# Patient Record
Sex: Female | Born: 1950 | Race: White | Hispanic: No | Marital: Married | Smoking: Never smoker
Health system: Southern US, Community
[De-identification: ages and names within clinical notes are randomized; demographics above are authoritative.]

## PROBLEM LIST (undated history)

## (undated) DIAGNOSIS — M8580 Other specified disorders of bone density and structure, unspecified site: Secondary | ICD-10-CM

## (undated) DIAGNOSIS — T4145XA Adverse effect of unspecified anesthetic, initial encounter: Secondary | ICD-10-CM

## (undated) DIAGNOSIS — T8859XA Other complications of anesthesia, initial encounter: Secondary | ICD-10-CM

## (undated) HISTORY — PX: COLONOSCOPY W/ POLYPECTOMY: SHX1380

## (undated) HISTORY — DX: Other specified disorders of bone density and structure, unspecified site: M85.80

## (undated) HISTORY — PX: TUBAL LIGATION: SHX77

## (undated) HISTORY — PX: COLONOSCOPY: SHX174

## (undated) HISTORY — PX: WISDOM TOOTH EXTRACTION: SHX21

---

## 2002-03-23 ENCOUNTER — Other Ambulatory Visit: Admission: RE | Admit: 2002-03-23 | Discharge: 2002-03-23 | Payer: Self-pay | Admitting: Obstetrics and Gynecology

## 2002-04-06 ENCOUNTER — Ambulatory Visit (HOSPITAL_COMMUNITY): Admission: RE | Admit: 2002-04-06 | Discharge: 2002-04-06 | Payer: Self-pay

## 2002-04-06 ENCOUNTER — Encounter: Payer: Self-pay | Admitting: Obstetrics and Gynecology

## 2002-11-02 ENCOUNTER — Ambulatory Visit (HOSPITAL_COMMUNITY): Admission: RE | Admit: 2002-11-02 | Discharge: 2002-11-02 | Payer: Self-pay | Admitting: Gastroenterology

## 2002-11-02 ENCOUNTER — Encounter (INDEPENDENT_AMBULATORY_CARE_PROVIDER_SITE_OTHER): Payer: Self-pay | Admitting: Specialist

## 2003-04-10 ENCOUNTER — Other Ambulatory Visit: Admission: RE | Admit: 2003-04-10 | Discharge: 2003-04-10 | Payer: Self-pay | Admitting: Obstetrics and Gynecology

## 2003-04-18 ENCOUNTER — Ambulatory Visit (HOSPITAL_COMMUNITY): Admission: RE | Admit: 2003-04-18 | Discharge: 2003-04-18 | Payer: Self-pay | Admitting: Obstetrics and Gynecology

## 2003-04-18 ENCOUNTER — Encounter: Payer: Self-pay | Admitting: Obstetrics and Gynecology

## 2004-05-28 ENCOUNTER — Ambulatory Visit (HOSPITAL_COMMUNITY): Admission: RE | Admit: 2004-05-28 | Discharge: 2004-05-28 | Payer: Self-pay | Admitting: Obstetrics and Gynecology

## 2005-03-10 ENCOUNTER — Other Ambulatory Visit: Admission: RE | Admit: 2005-03-10 | Discharge: 2005-03-10 | Payer: Self-pay | Admitting: Obstetrics and Gynecology

## 2005-08-04 ENCOUNTER — Ambulatory Visit (HOSPITAL_COMMUNITY): Admission: RE | Admit: 2005-08-04 | Discharge: 2005-08-04 | Payer: Self-pay | Admitting: Gynecology

## 2006-04-20 ENCOUNTER — Other Ambulatory Visit: Admission: RE | Admit: 2006-04-20 | Discharge: 2006-04-20 | Payer: Self-pay | Admitting: Obstetrics and Gynecology

## 2006-09-06 ENCOUNTER — Ambulatory Visit (HOSPITAL_COMMUNITY): Admission: RE | Admit: 2006-09-06 | Discharge: 2006-09-06 | Payer: Self-pay | Admitting: Obstetrics and Gynecology

## 2007-06-19 ENCOUNTER — Other Ambulatory Visit: Admission: RE | Admit: 2007-06-19 | Discharge: 2007-06-19 | Payer: Self-pay | Admitting: Obstetrics and Gynecology

## 2007-06-20 ENCOUNTER — Ambulatory Visit (HOSPITAL_COMMUNITY): Admission: RE | Admit: 2007-06-20 | Discharge: 2007-06-20 | Payer: Self-pay | Admitting: Obstetrics and Gynecology

## 2008-11-15 ENCOUNTER — Encounter: Payer: Self-pay | Admitting: Gynecology

## 2008-11-15 ENCOUNTER — Other Ambulatory Visit: Admission: RE | Admit: 2008-11-15 | Discharge: 2008-11-15 | Payer: Self-pay | Admitting: Gynecology

## 2008-11-15 ENCOUNTER — Ambulatory Visit: Payer: Self-pay | Admitting: Gynecology

## 2008-12-17 ENCOUNTER — Ambulatory Visit: Payer: Self-pay | Admitting: Obstetrics and Gynecology

## 2009-01-10 ENCOUNTER — Ambulatory Visit: Payer: Self-pay | Admitting: Gynecology

## 2009-01-15 ENCOUNTER — Ambulatory Visit: Payer: Self-pay | Admitting: Gynecology

## 2009-01-24 ENCOUNTER — Ambulatory Visit: Payer: Self-pay | Admitting: Gynecology

## 2009-04-07 ENCOUNTER — Ambulatory Visit: Payer: Self-pay | Admitting: Gynecology

## 2009-04-29 ENCOUNTER — Ambulatory Visit: Payer: Self-pay | Admitting: Gynecology

## 2010-01-07 ENCOUNTER — Ambulatory Visit: Payer: Self-pay | Admitting: Gynecology

## 2011-01-15 NOTE — Op Note (Signed)
Mandy Willis, Mandy Willis                         ACCOUNT NO.:  [id number]   MEDICAL RECORD NO.:  [id number]                   PATIENT TYPE:  AMB   LOCATION:  ENDO                                 FACILITY:  [facility]   PHYSICIAN:  Anselmo Rod, M.D.               DATE OF BIRTH:  September 04, 1950   DATE OF PROCEDURE:  11/02/2002  DATE OF DISCHARGE:                                 OPERATIVE REPORT   PROCEDURE:  Colonoscopy with snare polypectomy x1.   ENDOSCOPIST:  Anselmo Rod, M.D.   INSTRUMENT USED:  Pediatric adjustable Olympus colonoscope.   INDICATION FOR PROCEDURE:  A 60 year old white female undergoing screening  colonoscopy.  Rule out colonic polyps, masses, etc.   PREPROCEDURE PREPARATION:  Informed consent was procured from the patient.  The patient was fasted for eight hours prior to the procedure and prepped  with a bottle of Miralax and Gatorade the night prior to the procedure.   PREPROCEDURE PHYSICAL:  VITAL SIGNS:  The patient had stable vital signs.  NECK:  Supple.  CHEST:  Clear to auscultation.  S1, S2 regular.  ABDOMEN:  Soft with normal bowel sounds.   DESCRIPTION OF PROCEDURE:  The patient was placed in the left lateral  decubitus position and sedated with 80 mg of Demerol and 10 mg of Versed  intravenously.  Once the patient was adequately sedate and maintained on low-  flow oxygen and continuous cardiac monitoring, the Olympus video colonoscope  was advanced from the rectum to the cecum without difficulty.  The patient  had some residual stool in the colon, and multiple washes were done.  A  small sessile polyp was snared from 15 cm.  There was a significant amount  of stool in the cecum and right colon.  The patient's position was changed  from the left lateral and the supine to the right lateral position to reach  the cecal base and remove the stool pool.  No large masses, diverticulosis,  erosions, or ulcerations were seen.   IMPRESSION:  1. Small  sessile polyp snared from 15 cm.  2. Some residual stool in the colon.  Very small lesions could have been     missed.  3. No evidence of diverticulosis.    RECOMMENDATIONS:  1. Await pathology results.  2. Avoid all nonsteroidals, including aspirin, for the next two weeks.  3. Outpatient follow-up in the next two weeks for further recommendations     depending upon the pathology results.                                               Anselmo Rod, M.D.    JNM/MEDQ  D:  11/03/2002  T:  11/04/2002  Job:  [id number]   cc:   Dois Davenport A. Rivard,  M.D.  [address]  [place]  [place] [address]  Fax: [phone]

## 2011-10-15 ENCOUNTER — Encounter: Payer: Self-pay | Admitting: *Deleted

## 2011-10-20 ENCOUNTER — Encounter: Payer: Self-pay | Admitting: Gynecology

## 2011-10-20 ENCOUNTER — Ambulatory Visit (INDEPENDENT_AMBULATORY_CARE_PROVIDER_SITE_OTHER): Payer: BC Managed Care – PPO | Admitting: Gynecology

## 2011-10-20 ENCOUNTER — Other Ambulatory Visit (HOSPITAL_COMMUNITY)
Admission: RE | Admit: 2011-10-20 | Discharge: 2011-10-20 | Disposition: A | Discharge status: Home / Self Care | Payer: BC Managed Care – PPO | Source: Ambulatory Visit | Attending: Gynecology | Admitting: Gynecology

## 2011-10-20 VITALS — BP 108/70 | Ht 66.5 in | Wt 191.0 lb

## 2011-10-20 DIAGNOSIS — Z13220 Encounter for screening for lipoid disorders: Secondary | ICD-10-CM

## 2011-10-20 DIAGNOSIS — M8580 Other specified disorders of bone density and structure, unspecified site: Secondary | ICD-10-CM

## 2011-10-20 DIAGNOSIS — Z1211 Encounter for screening for malignant neoplasm of colon: Secondary | ICD-10-CM

## 2011-10-20 DIAGNOSIS — Z01419 Encounter for gynecological examination (general) (routine) without abnormal findings: Secondary | ICD-10-CM

## 2011-10-20 DIAGNOSIS — M899 Disorder of bone, unspecified: Secondary | ICD-10-CM

## 2011-10-20 DIAGNOSIS — Z131 Encounter for screening for diabetes mellitus: Secondary | ICD-10-CM

## 2011-10-20 NOTE — Progress Notes (Signed)
CALAH GERSHMAN September 10, 1950 [id number]        61 y.o.  for annual exam.  Doing well without complaints.  Past medical history,surgical history, medications, allergies, family history and social history were all reviewed and documented in the EPIC chart. ROS:  Was performed and pertinent positives and negatives are included in the history.  Exam: Sherrilyn Rist chaperone present Filed Vitals:   10/20/11 0958  BP: 108/70   General appearance  Normal Skin grossly normal Head/Neck normal with no cervical or supraclavicular adenopathy thyroid normal Lungs  clear Cardiac RR, without RMG Abdominal  soft, nontender, without masses, organomegaly or hernia Breasts  examined lying and sitting without masses, retractions, discharge or axillary adenopathy. Pelvic  Ext/BUS/vagina  normal with2 atrophic changes  Cervix  normal   Pap done  Uterus  axial, normal size, shape and contour, midline and mobile nontender   Adnexa  Without masses or tenderness    Anus and perineum  normal   Rectovaginal  normal sphincter tone without palpated masses or tenderness.    Assessment/Plan:  61 y.o. female for annual exam.   Doing well without bleeding or significant menopausal symptoms. 1. Osteopenia. Patient has DEXA 11/2008 shows osteopenia -2.0. Her FRAX shows ten-year probability of major fracture 6.6 a hip fracture 0.4.due for repeat now. Patient refuses to do this. Patient says she would never take medication to treat this and declines DEXA. I reviewed the issues of -2.0 nearing osteoporosis and the possible need for treatment and again she declines. I will check a vitamin D and she can continue on extra calcium vitamin D. 2. Mammogram. Patient's overdue for mammogram with last mammogram 2008. I strongly recommend her to schedule this and she acknowledges my recommendation. SBE monthly reviewed. 3. Pap smear. Pap smear was done today 4. Colonoscopy. Patient had colonoscopies historically 5 years ago which was normal.  Stool blood check today done. 5. Health maintenance. Baseline CBC glucose urinalysis lipid profile done with her vitamin D level. Assuming she continues well from a gynecologic standpoint then she will see [place] in a year, sooner as needed.    Dara Lords MD, 10:28 AM 10/20/2011

## 2011-10-20 NOTE — Patient Instructions (Signed)
Recommend DEXA bone density study. Schedule mammogram. Follow up for her annual gynecologic exam in one year.

## 2011-10-21 ENCOUNTER — Other Ambulatory Visit: Payer: Self-pay

## 2011-10-21 DIAGNOSIS — E7800 Pure hypercholesterolemia, unspecified: Secondary | ICD-10-CM

## 2011-10-21 LAB — CBC WITH DIFFERENTIAL/PLATELET
Eosinophils Absolute: 0.1 10*3/uL (ref 0.0–0.7)
Eosinophils Relative: 1 % (ref 0–5)
Lymphocytes Relative: 33 % (ref 12–46)
Monocytes Absolute: 0.4 10*3/uL (ref 0.1–1.0)
Neutrophils Relative %: 59 % (ref 43–77)
Platelets: 217 10*3/uL (ref 150–400)
RBC: 4.14 MIL/uL (ref 3.87–5.11)
RDW: 13.9 % (ref 11.5–15.5)
WBC: 5.3 10*3/uL (ref 4.0–10.5)

## 2011-10-21 LAB — URINALYSIS W MICROSCOPIC + REFLEX CULTURE
Crystals: NONE SEEN
Glucose, UA: NEGATIVE mg/dL
Hgb urine dipstick: NEGATIVE
Ketones, ur: NEGATIVE mg/dL
Specific Gravity, Urine: 1.015 (ref 1.005–1.030)
Squamous Epithelial / LPF: NONE SEEN

## 2011-10-21 LAB — POC HEMOCCULT BLD/STL (OFFICE/1-CARD/DIAGNOSTIC): Fecal Occult Blood, POC: NEGATIVE

## 2011-10-21 LAB — VITAMIN D 25 HYDROXY (VIT D DEFICIENCY, FRACTURES): Vit D, 25-Hydroxy: 43 ng/mL (ref 30–89)

## 2011-10-22 LAB — URINE CULTURE

## 2011-10-26 ENCOUNTER — Other Ambulatory Visit: Payer: BC Managed Care – PPO

## 2011-10-26 ENCOUNTER — Encounter: Payer: Self-pay | Admitting: Gynecology

## 2011-10-26 DIAGNOSIS — E7800 Pure hypercholesterolemia, unspecified: Secondary | ICD-10-CM

## 2011-10-26 LAB — LIPID PANEL
Cholesterol: 234 mg/dL — ABNORMAL HIGH (ref 0–200)
HDL: 85 mg/dL (ref >39)
LDL Cholesterol: 128 mg/dL — ABNORMAL HIGH (ref 0–99)
Triglycerides: 103 mg/dL (ref <150)
VLDL: 21 mg/dL (ref 0–40)

## 2012-07-06 ENCOUNTER — Encounter: Payer: Self-pay | Admitting: Gynecology

## 2012-07-06 ENCOUNTER — Ambulatory Visit (INDEPENDENT_AMBULATORY_CARE_PROVIDER_SITE_OTHER): Payer: BC Managed Care – PPO | Admitting: Gynecology

## 2012-07-06 DIAGNOSIS — R300 Dysuria: Secondary | ICD-10-CM

## 2012-07-06 DIAGNOSIS — N952 Postmenopausal atrophic vaginitis: Secondary | ICD-10-CM

## 2012-07-06 DIAGNOSIS — N9089 Other specified noninflammatory disorders of vulva and perineum: Secondary | ICD-10-CM

## 2012-07-06 LAB — URINALYSIS W MICROSCOPIC + REFLEX CULTURE
Crystals: NONE SEEN
Glucose, UA: NEGATIVE mg/dL
Ketones, ur: NEGATIVE mg/dL
Nitrite: NEGATIVE
Specific Gravity, Urine: 1.02 (ref 1.005–1.030)

## 2012-07-06 LAB — WET PREP FOR TRICH, YEAST, CLUE: Yeast Wet Prep HPF POC: NONE SEEN

## 2012-07-06 MED ORDER — CLINDAMYCIN PHOSPHATE 2 % VA CREA: 1.0000 | TOPICAL_CREAM | Freq: Every day | VAGINAL | Status: DC

## 2012-07-06 NOTE — Progress Notes (Signed)
Patient awoke this morning with vaginal stinging with urination. Does not have bladder symptoms per se such as dysuria/spasm but notes when the urine hits externally there is some burning. No discharge itching or other symptoms.  Exam was kim assistant Abdomen soft nontender without masses guarding organomegaly Pelvic external BUS vagina with atrophic changes slight white discharge.  Cervix atrophic. Uterus normal size midline mobile nontender. Adnexa without masses or tenderness.  Assessment and plan: Symptoms and wet prep consistent with BV. Treat with Cleocin vaginal cream nightly x7. Follow up if symptoms persist or recur. UA is low level abnormal. We'll follow up on culture.

## 2012-07-06 NOTE — Addendum Note (Signed)
Addended by: Dayna Barker on: 07/06/2012 11:44 AM   Modules accepted: Orders

## 2012-07-06 NOTE — Patient Instructions (Signed)
Use Cleocin vaginal cream nightly for a week. Follow up if symptoms persist or worsen.

## 2012-07-11 ENCOUNTER — Other Ambulatory Visit: Payer: Self-pay | Admitting: Gynecology

## 2012-07-11 DIAGNOSIS — N390 Urinary tract infection, site not specified: Secondary | ICD-10-CM

## 2012-10-14 ENCOUNTER — Other Ambulatory Visit: Payer: Self-pay

## 2013-07-05 ENCOUNTER — Other Ambulatory Visit: Payer: Self-pay

## 2013-11-16 ENCOUNTER — Ambulatory Visit (INDEPENDENT_AMBULATORY_CARE_PROVIDER_SITE_OTHER): Payer: No Typology Code available for payment source | Admitting: Family Medicine

## 2013-11-16 VITALS — BP 100/72 | HR 97 | Temp 98.1°F | Resp 18 | Ht 66.5 in | Wt 182.0 lb

## 2013-11-16 DIAGNOSIS — R319 Hematuria, unspecified: Secondary | ICD-10-CM

## 2013-11-16 DIAGNOSIS — N3090 Cystitis, unspecified without hematuria: Secondary | ICD-10-CM

## 2013-11-16 DIAGNOSIS — R309 Painful micturition, unspecified: Secondary | ICD-10-CM

## 2013-11-16 DIAGNOSIS — R300 Dysuria: Secondary | ICD-10-CM

## 2013-11-16 DIAGNOSIS — R350 Frequency of micturition: Secondary | ICD-10-CM

## 2013-11-16 LAB — POCT URINALYSIS DIPSTICK
Bilirubin, UA: NEGATIVE
Glucose, UA: NEGATIVE
Ketones, UA: NEGATIVE
Nitrite, UA: NEGATIVE
Protein, UA: 100
Spec Grav, UA: 1.025
Urobilinogen, UA: 0.2
pH, UA: 5.5

## 2013-11-16 LAB — POCT UA - MICROSCOPIC ONLY
Bacteria, U Microscopic: NEGATIVE
Casts, Ur, LPF, POC: NEGATIVE
Crystals, Ur, HPF, POC: NEGATIVE
Epithelial cells, urine per micros: NEGATIVE
Mucus, UA: NEGATIVE
Yeast, UA: NEGATIVE

## 2013-11-16 MED ORDER — SULFAMETHOXAZOLE-TMP DS 800-160 MG PO TABS: 1.0000 | ORAL_TABLET | Freq: Two times a day (BID) | ORAL | Status: DC

## 2013-11-16 MED ORDER — PHENAZOPYRIDINE HCL 200 MG PO TABS: 200.0000 mg | ORAL_TABLET | Freq: Three times a day (TID) | ORAL | Status: DC | PRN

## 2013-11-16 NOTE — Progress Notes (Signed)
Subjective: 63 year old lady who has been having urinary symptoms all week. She drank a lot of cranberry and thought she was doing better. This morning it hit her hard. She has been urinating small her. She in frequently gets UTIs, about 4 year intervals. No fever chills or abdominal pain.  Objective: Pleasant alert healthy appearing lady. No CVA tenderness. Abdomen soft with only mild suprapubic pressure sensation but nowhere pain. Afebrile.  Results for orders placed in visit on 11/16/13  POCT URINALYSIS DIPSTICK      Result Value Ref Range   Color, UA yellow     Clarity, UA cloudy     Glucose, UA neg     Bilirubin, UA neg     Ketones, UA neg     Spec Grav, UA 1.025     Blood, UA large     pH, UA 5.5     Protein, UA 100     Urobilinogen, UA 0.2     Nitrite, UA neg     Leukocytes, UA large (3+)    POCT UA - MICROSCOPIC ONLY      Result Value Ref Range   WBC, Ur, HPF, POC TNTC     RBC, urine, microscopic TNTC     Bacteria, U Microscopic neg     Mucus, UA neg     Epithelial cells, urine per micros neg     Crystals, Ur, HPF, POC neg     Casts, Ur, LPF, POC neg     Yeast, UA neg     Assessment: Dysuria Hematuria Urinary tract infection/hemorrhagic cystitis  Plan: Bactrim Pyridium

## 2013-11-18 LAB — URINE CULTURE: Colony Count: >=100000

## 2014-06-14 ENCOUNTER — Other Ambulatory Visit: Payer: Self-pay

## 2015-01-19 ENCOUNTER — Emergency Department (HOSPITAL_COMMUNITY)
Admission: EM | Admit: 2015-01-19 | Discharge: 2015-01-19 | Disposition: A | Discharge status: Home / Self Care | Payer: No Typology Code available for payment source | Attending: Emergency Medicine | Admitting: Emergency Medicine

## 2015-01-19 ENCOUNTER — Encounter (HOSPITAL_COMMUNITY): Payer: Self-pay | Admitting: Emergency Medicine

## 2015-01-19 DIAGNOSIS — Y929 Unspecified place or not applicable: Secondary | ICD-10-CM | POA: Insufficient documentation

## 2015-01-19 DIAGNOSIS — X58XXXA Exposure to other specified factors, initial encounter: Secondary | ICD-10-CM | POA: Diagnosis not present

## 2015-01-19 DIAGNOSIS — Y939 Activity, unspecified: Secondary | ICD-10-CM | POA: Diagnosis not present

## 2015-01-19 DIAGNOSIS — H18821 Corneal disorder due to contact lens, right eye: Secondary | ICD-10-CM | POA: Insufficient documentation

## 2015-01-19 DIAGNOSIS — S0591XA Unspecified injury of right eye and orbit, initial encounter: Secondary | ICD-10-CM | POA: Diagnosis present

## 2015-01-19 DIAGNOSIS — Y999 Unspecified external cause status: Secondary | ICD-10-CM | POA: Insufficient documentation

## 2015-01-19 MED ORDER — MOXIFLOXACIN HCL 0.5 % OP SOLN: 1.0000 [drp] | Freq: Three times a day (TID) | OPHTHALMIC | Status: DC

## 2015-01-19 MED ORDER — TETRACAINE HCL 0.5 % OP SOLN
1.0000 [drp] | Freq: Once | OPHTHALMIC | Status: AC
  Administered 2015-01-19: 1 [drp] via OPHTHALMIC
  Filled 2015-01-19: qty 2

## 2015-01-19 MED ORDER — FLUORESCEIN SODIUM 1 MG OP STRP
1.0000 | ORAL_STRIP | Freq: Once | OPHTHALMIC | Status: AC
  Administered 2015-01-19: 1 via OPHTHALMIC
  Filled 2015-01-19: qty 1

## 2015-01-19 NOTE — ED Notes (Signed)
Pt c/o 4/10 eye pain, eye locks swollen and red at this time. Pt denies any injury to her eye.

## 2015-01-19 NOTE — Discharge Instructions (Signed)
Return here as needed.  Follow-up with the eye doctor provided by calling their office first thing in the morning

## 2015-01-19 NOTE — ED Provider Notes (Signed)
CSN: [id number]     Arrival date & time 01/19/15  0419 History   First MD Initiated Contact with Patient 01/19/15 [phone]     Chief Complaint  Patient presents with  . Eye Pain     (Consider location/radiation/quality/duration/timing/severity/associated sxs/prior Treatment) HPI Patient presents to the emergency department with right eye irritation that started this morning.  The patient states that she woke up and noticed that she had some drainage from her eye and she states that it was mildly sore at the time.  She looked in the mirror and noticed that her eye was red and swollen.  Patient states that she does not recall any injury or trauma to the eye.  Patient does wear a contact lens in that eye.  Patient states that nothing seems to make the pain better and nothing seems to make the pain worse.  Patient does not have any light sensitivity pain with range of motion of the or severe pain.  She states that the sore, but not significantly painful .  The patient denies blurred vision, headache, weakness, dizziness, neck pain, fever or syncope.  The patient states that she did not strike any medications prior to arrival.  He did use cool compresses on the tooth that would help, but states that it did not seem to improve the condition History reviewed. No pertinent past medical history. Past Surgical History  Procedure Laterality Date  . Tubal ligation     Family History  Problem Relation Age of Onset  . Hypertension Mother   . Hypertension Maternal Grandmother   . Glaucoma Paternal Grandmother   . Glaucoma Paternal Grandfather   . Hypertension Father   . Hypertension Sister    History  Substance Use Topics  . Smoking status: Never Smoker   . Smokeless tobacco: Never Used  . Alcohol Use: Yes   OB History    Gravida Para Term Preterm AB TAB SAB Ectopic Multiple Living   2 2        2      Review of Systems  All other systems negative except as documented in the HPI. All pertinent  positives and negatives as reviewed in the HPI.  Allergies  Other  Home Medications   Prior to Admission medications   Medication Sig Start Date End Date Taking? Authorizing Provider  clindamycin (CLEOCIN) 2 % vaginal cream Place 1 Applicatorful vaginally at bedtime. Patient not taking: Reported on 01/19/2015 07/06/12   [name], MD  moxifloxacin (VIGAMOX) 0.5 % ophthalmic solution Place 1 drop into the right eye 3 (three) times daily. 01/19/15   Dalia Heading, PA-C  phenazopyridine (PYRIDIUM) 200 MG tablet Take 1 tablet (200 mg total) by mouth 3 (three) times daily as needed for pain. Patient not taking: Reported on 01/19/2015 11/16/13   Posey Boyer, MD  sulfamethoxazole-trimethoprim (BACTRIM DS) 800-160 MG per tablet Take 1 tablet by mouth 2 (two) times daily. Patient not taking: Reported on 01/19/2015 11/16/13   Posey Boyer, MD   BP 115/70 mmHg  Pulse 86  Temp(Src) 97.9 F (36.6 C) (Oral)  Resp 20  Ht 5\' 6"  (1.676 m)  Wt 182 lb (82.555 kg)  BMI 29.39 kg/m2  SpO2 96%  LMP 08/31/1999 Physical Exam  Constitutional: She is oriented to person, place, and time. She appears well-developed and well-nourished. No distress.  HENT:  Head: Normocephalic and atraumatic.  Mouth/Throat: Oropharynx is clear and moist.  Eyes: Pupils are equal, round, and reactive to light. Right eye exhibits  discharge and exudate. Right conjunctiva is injected. Right conjunctiva has no hemorrhage. Right eye exhibits normal extraocular motion and no nystagmus.  Slit lamp exam:      The right eye shows corneal abrasion and fluorescein uptake.  Neck: Normal range of motion. Neck supple.  Cardiovascular: Normal rate, regular rhythm and normal heart sounds.  Exam reveals no gallop and no friction rub.   No murmur heard. Pulmonary/Chest: Effort normal and breath sounds normal.  Neurological: She is alert and oriented to person, place, and time. She exhibits normal muscle tone. Coordination normal.   Skin: Skin is warm and dry. No erythema.  Nursing note and vitals reviewed.   ED Course  Procedures (including critical care time)  The patient will be given Vigamox advised to follow-up with the ophthalmologist provided.  Patient agrees the plan.  All questions were answered.  I told to return here as needed.  Patient did have what looked like a corneal abrasion    Dalia Heading, PA-C 01/19/15 [place], MD 01/19/15 [phone]

## 2015-01-19 NOTE — ED Notes (Signed)
Flushed right eye with 10cc's ns flush per provider request.  Tolerated well.

## 2015-11-18 ENCOUNTER — Ambulatory Visit (INDEPENDENT_AMBULATORY_CARE_PROVIDER_SITE_OTHER): Payer: Medicare HMO | Admitting: Gynecology

## 2015-11-18 ENCOUNTER — Encounter: Payer: Self-pay | Admitting: Gynecology

## 2015-11-18 VITALS — BP 118/74 | Ht 66.5 in | Wt 193.0 lb

## 2015-11-18 DIAGNOSIS — N952 Postmenopausal atrophic vaginitis: Secondary | ICD-10-CM

## 2015-11-18 DIAGNOSIS — Z01419 Encounter for gynecological examination (general) (routine) without abnormal findings: Secondary | ICD-10-CM | POA: Diagnosis not present

## 2015-11-18 DIAGNOSIS — Z124 Encounter for screening for malignant neoplasm of cervix: Secondary | ICD-10-CM | POA: Diagnosis not present

## 2015-11-18 DIAGNOSIS — M8580 Other specified disorders of bone density and structure, unspecified site: Secondary | ICD-10-CM | POA: Diagnosis not present

## 2015-11-18 NOTE — Patient Instructions (Signed)
Follow up for bone density as scheduled  Call Dr. Collene Mares to schedule your colonoscopy.  Schedule an appointment with a primary physician to establish routine healthcare and blood work  Call to Schedule your mammogram  Facilities in [place]: 1)  [place] [place] Phone: [phone] 2)  Dr. Isaiah Blakes at [facility] [address] Phone: [phone]     Mammogram A mammogram is an X-ray test to find changes in a woman's breast. You should get a mammogram if:  You are 65 years of age or older  You have risk factors.   Your doctor recommends that you have one.  BEFORE THE TEST  Do not schedule the test the week before your period, especially if your breasts are sore during this time.  On the day of your mammogram:  Wash your breasts and armpits well. After washing, do not put on any deodorant or talcum powder on until after your test.   Eat and drink as you usually do.   Take your medicines as usual.   If you are diabetic and take insulin, make sure you:   Eat before coming for your test.   Take your insulin as usual.   If you cannot keep your appointment, call before the appointment to cancel. Schedule another appointment.  TEST  You will need to undress from the waist up. You will put on a hospital gown.   Your breast will be put on the mammogram machine, and it will press firmly on your breast with a piece of plastic called a compression paddle. This will make your breast flatter so that the machine can X-ray all parts of your breast.   Both breasts will be X-rayed. Each breast will be X-rayed from above and from the side. An X-ray might need to be taken again if the picture is not good enough.   The mammogram will last about 15 to 30 minutes.  AFTER THE TEST Finding out the results of your test Ask when your test results will be ready. Make sure you get your test  results.  Document Released: 11/12/2008 Document Revised: 08/05/2011 Document Reviewed: 11/12/2008 [facility] Patient Information 2012 [place].

## 2015-11-18 NOTE — Addendum Note (Signed)
Addended by: Nelva Nay on: 11/18/2015 02:26 PM   Modules accepted: Orders

## 2015-11-18 NOTE — Progress Notes (Signed)
    Mandy Willis Apr 28, 1951 [id number]        65 y.o.  G2P2  for breast and pelvic exam. Has not been in the office for over 3 years. Doing well without complaints.  Past medical history,surgical history, problem list, medications, allergies, family history and social history were all reviewed and documented as reviewed in the EPIC chart.  ROS:  Performed with pertinent positives and negatives included in the history, assessment and plan.   Additional significant findings :  none   Exam: Mandy Willis assistant Filed Vitals:   11/18/15 1359  BP: 118/74  Height: 5' 6.5" (1.689 m)  Weight: 193 lb (87.544 kg)   General appearance:  Normal affect, orientation and appearance. Skin: Grossly normal HEENT: Without gross lesions.  No cervical or supraclavicular adenopathy. Thyroid normal.  Lungs:  Clear without wheezing, rales or rhonchi Cardiac: RR, without RMG Abdominal:  Soft, nontender, without masses, guarding, rebound, organomegaly or hernia Breasts:  Examined lying and sitting without masses, retractions, discharge or axillary adenopathy. Pelvic:  Ext/BUS/vagina with atrophic changes  Cervix with atrophic changes. Pap smear done  Uterus anteverted, normal size, shape and contour, midline and mobile nontender   Adnexa without masses or tenderness    Anus and perineum normal   Rectovaginal normal sphincter tone without palpated masses or tenderness.    Assessment/Plan:  65 y.o. G2P2 female for breast and pelvic exam.   1. Postmenopausal/atrophic genital changes. No significant hot flushes, night sweats, vaginal dryness or any vaginal bleeding. Continue to monitor and report any issues or vaginal bleeding. 2. Mammography 2008. Patient knows she is way overdue and agrees to schedule. Names and numbers provided.  SBE monthly reviewed. 3. Pap smear 2013. Pap smear done today.  No history of abnormal Pap smears previously 4. Osteopenia. DEXA 2010  T score -2.0 FRAX 6.6%/0.4%. Schedule  DEXA now and patient agrees to do so.  Increase calcium vitamin D. 5. Colonoscopy 2006. Recommended patient schedule colonoscopy. Dr. Collene Willis did her last colonoscopy and patient agrees to call to schedule. 6. Health maintenance. No routine lab work done today. Patient plans to establish care with a primary physician who sees her husband for ongoing general healthcare and will have her routine lab work done through their office. Follow up 1 year, sooner as needed.   Mandy Auerbach MD, 2:19 PM 11/18/2015

## 2015-11-19 LAB — PAP IG W/ RFLX HPV ASCU

## 2015-11-29 DIAGNOSIS — M8580 Other specified disorders of bone density and structure, unspecified site: Secondary | ICD-10-CM

## 2015-11-29 HISTORY — DX: Other specified disorders of bone density and structure, unspecified site: M85.80

## 2015-12-09 ENCOUNTER — Encounter: Payer: Self-pay | Admitting: Gynecology

## 2015-12-09 ENCOUNTER — Ambulatory Visit (INDEPENDENT_AMBULATORY_CARE_PROVIDER_SITE_OTHER): Payer: Medicare HMO

## 2015-12-09 ENCOUNTER — Other Ambulatory Visit: Payer: Self-pay | Admitting: Gynecology

## 2015-12-09 DIAGNOSIS — M899 Disorder of bone, unspecified: Secondary | ICD-10-CM

## 2015-12-09 DIAGNOSIS — M8580 Other specified disorders of bone density and structure, unspecified site: Secondary | ICD-10-CM

## 2015-12-09 DIAGNOSIS — Z01419 Encounter for gynecological examination (general) (routine) without abnormal findings: Secondary | ICD-10-CM

## 2015-12-19 ENCOUNTER — Other Ambulatory Visit: Payer: Self-pay

## 2015-12-19 DIAGNOSIS — Z0000 Encounter for general adult medical examination without abnormal findings: Secondary | ICD-10-CM | POA: Diagnosis not present

## 2015-12-19 DIAGNOSIS — Z1231 Encounter for screening mammogram for malignant neoplasm of breast: Secondary | ICD-10-CM

## 2015-12-19 DIAGNOSIS — J309 Allergic rhinitis, unspecified: Secondary | ICD-10-CM | POA: Diagnosis not present

## 2015-12-19 DIAGNOSIS — Z1211 Encounter for screening for malignant neoplasm of colon: Secondary | ICD-10-CM | POA: Diagnosis not present

## 2015-12-23 DIAGNOSIS — Z0000 Encounter for general adult medical examination without abnormal findings: Secondary | ICD-10-CM | POA: Diagnosis not present

## 2015-12-23 DIAGNOSIS — Z131 Encounter for screening for diabetes mellitus: Secondary | ICD-10-CM | POA: Diagnosis not present

## 2015-12-23 DIAGNOSIS — Z136 Encounter for screening for cardiovascular disorders: Secondary | ICD-10-CM | POA: Diagnosis not present

## 2016-01-05 ENCOUNTER — Ambulatory Visit
Admission: RE | Admit: 2016-01-05 | Discharge: 2016-01-05 | Disposition: A | Discharge status: Home / Self Care | Payer: Medicare HMO | Source: Ambulatory Visit

## 2016-01-05 DIAGNOSIS — Z1231 Encounter for screening mammogram for malignant neoplasm of breast: Secondary | ICD-10-CM

## 2016-02-11 DIAGNOSIS — R69 Illness, unspecified: Secondary | ICD-10-CM | POA: Diagnosis not present

## 2016-09-06 DIAGNOSIS — M62830 Muscle spasm of back: Secondary | ICD-10-CM | POA: Diagnosis not present

## 2016-09-06 DIAGNOSIS — M545 Low back pain: Secondary | ICD-10-CM | POA: Diagnosis not present

## 2016-09-27 DIAGNOSIS — Z0101 Encounter for examination of eyes and vision with abnormal findings: Secondary | ICD-10-CM | POA: Diagnosis not present

## 2016-10-06 DIAGNOSIS — Z0101 Encounter for examination of eyes and vision with abnormal findings: Secondary | ICD-10-CM | POA: Diagnosis not present

## 2016-10-13 DIAGNOSIS — Z0101 Encounter for examination of eyes and vision with abnormal findings: Secondary | ICD-10-CM | POA: Diagnosis not present

## 2016-11-14 DIAGNOSIS — S52121A Displaced fracture of head of right radius, initial encounter for closed fracture: Secondary | ICD-10-CM | POA: Diagnosis not present

## 2016-11-15 DIAGNOSIS — S52121A Displaced fracture of head of right radius, initial encounter for closed fracture: Secondary | ICD-10-CM | POA: Diagnosis not present

## 2016-11-15 DIAGNOSIS — M25521 Pain in right elbow: Secondary | ICD-10-CM | POA: Diagnosis not present

## 2016-11-16 ENCOUNTER — Ambulatory Visit
Admission: RE | Admit: 2016-11-16 | Discharge: 2016-11-16 | Disposition: A | Discharge status: Home / Self Care | Payer: Medicare HMO | Source: Ambulatory Visit | Attending: Orthopedic Surgery | Admitting: Orthopedic Surgery

## 2016-11-16 ENCOUNTER — Other Ambulatory Visit: Payer: Self-pay | Admitting: Orthopedic Surgery

## 2016-11-16 DIAGNOSIS — S52124A Nondisplaced fracture of head of right radius, initial encounter for closed fracture: Secondary | ICD-10-CM

## 2016-11-16 DIAGNOSIS — S52571A Other intraarticular fracture of lower end of right radius, initial encounter for closed fracture: Secondary | ICD-10-CM | POA: Diagnosis not present

## 2016-11-18 ENCOUNTER — Encounter: Payer: Self-pay | Admitting: Gynecology

## 2016-11-18 ENCOUNTER — Ambulatory Visit (INDEPENDENT_AMBULATORY_CARE_PROVIDER_SITE_OTHER): Payer: Medicare HMO | Admitting: Gynecology

## 2016-11-18 VITALS — BP 110/72 | Ht 66.5 in | Wt 192.0 lb

## 2016-11-18 DIAGNOSIS — Z01411 Encounter for gynecological examination (general) (routine) with abnormal findings: Secondary | ICD-10-CM | POA: Diagnosis not present

## 2016-11-18 DIAGNOSIS — M8580 Other specified disorders of bone density and structure, unspecified site: Secondary | ICD-10-CM

## 2016-11-18 DIAGNOSIS — N952 Postmenopausal atrophic vaginitis: Secondary | ICD-10-CM

## 2016-11-18 NOTE — Progress Notes (Signed)
    Mandy Willis 02-03-1951 [id number]        66 y.o.  G2P2 for breast and pelvic exam  Past medical history,surgical history, problem list, medications, allergies, family history and social history were all reviewed and documented as reviewed in the EPIC chart.  ROS:  Performed with pertinent positives and negatives included in the history, assessment and plan.   Additional significant findings :  None   Exam: Copywriter, advertising Vitals:   11/18/16 1424  BP: 110/72  Weight: 192 lb (87.1 kg)  Height: 5' 6.5" (1.689 m)   Body mass index is 30.53 kg/m.  General appearance:  Normal affect, orientation and appearance. Skin: Grossly normal HEENT: Without gross lesions.  No cervical or supraclavicular adenopathy. Thyroid normal.  Lungs:  Clear without wheezing, rales or rhonchi Cardiac: RR, without RMG Abdominal:  Soft, nontender, without masses, guarding, rebound, organomegaly or hernia Breasts:  Examined lying and sitting without masses, retractions, discharge or axillary adenopathy. Pelvic:  Ext, BUS, Vagina: With atrophic changes  Cervix: With atrophic changes  Uterus: Anteverted, normal size, shape and contour, midline and mobile nontender   Adnexa: Without masses or tenderness    Anus and perineum: Normal   Rectovaginal: Normal sphincter tone without palpated masses or tenderness.    Assessment/Plan:  66 y.o. G2P2 female for breast and pelvic exam  1. Postmenopausal/atrophic genital changes. No significant hot flushes, night sweats, vaginal dryness or any vaginal bleeding. Continue monitor report any issues or bleeding. 2. Mammography May 2017. Continue with annual mammography when due. SBE monthly reviewed. 3. Pap smear 2017. No Pap smear done today. No history of abnormal Pap smears previously. 4. Colonoscopy questionably due now. Patients going to call Dr. Lorie Apley office to find out when and will schedule if needed. 5. Osteopenia. DEXA 11/2015 T score -1.9 FRAX 8%/1%.  Stable from prior DEXA. Increased calcium vitamin D. 6. Health maintenance. No routine lab work done as patient does this elsewhere. Follow up 1 year, sooner as needed.   [name] MD, 2:42 PM 11/18/2016

## 2016-11-18 NOTE — Patient Instructions (Signed)
Check with Dr. Collene Mares about scheduling your colonoscopy.

## 2016-11-23 DIAGNOSIS — S52121D Displaced fracture of head of right radius, subsequent encounter for closed fracture with routine healing: Secondary | ICD-10-CM | POA: Diagnosis not present

## 2016-11-24 ENCOUNTER — Other Ambulatory Visit: Payer: Self-pay | Admitting: Orthopedic Surgery

## 2016-11-25 ENCOUNTER — Encounter (HOSPITAL_COMMUNITY): Payer: Self-pay | Admitting: *Deleted

## 2016-11-25 NOTE — H&P (Signed)
  Mandy Willis is an 66 y.o. female.   Chief Complaint: DISPLACED FRACTURE OF THE RIGHT RADIAL HEAD  HPI: THE PATIENT IS A 66 YEAR OLD LEFT HAND DOMINANT FEMALE WHO INJURED THE RIGHT ELBOW ON 11/13/16 DUE TO A FALL ON THE ARM.  SHE WAS SEEN IN AN URGENT CARE AND WAS PLACED IN A LONG ARM SPLINT. SHE WAS EVALUATED IN OUR OFFICE WHERE WE DISCUSSED THE NATURE OF HER INJURY AND THE RATIONALE FOR PROCEEDING WITH SURGERY.  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  DISCUSSED THE SURGICAL PROCEDURE, INCLUDING RISKS VERSUS BENEFITS, AND THE POST-OPERATIVE RECOVERY PROCESS. THE PATIENT IS HERE TODAY FOR SURGERY.   Past Medical History:  Diagnosis Date  . Osteopenia 11/2015   T score -1.9 FRAX 8%/1%    Past Surgical History:  Procedure Laterality Date  . TUBAL LIGATION      Family History  Problem Relation Age of Onset  . Hypertension Mother   . Hypertension Father   . Hypertension Sister   . Diabetes Sister   . Hypertension Maternal Grandmother   . Glaucoma Paternal Grandmother   . Glaucoma Paternal Grandfather    Social History:  reports that she has never smoked. She has never used smokeless tobacco. She reports that she drinks about 4.2 oz of alcohol per week . She reports that she does not use drugs.  Allergies:  Allergies  Allergen Reactions  . Garlic Swelling    Stomach swells    No prescriptions prior to admission.    No results found for this or any previous visit (from the past 48 hour(s)). No results found.  ROS NO RECENT ILLNESSES OR HOSPITALIZATIONS  Last menstrual period 08/31/1999. Physical Exam  General Appearance:  Alert, cooperative, no distress, appears stated age  Head:  Normocephalic, without obvious abnormality, atraumatic  Eyes:  Pupils equal, conjunctiva/corneas clear,         Throat: Lips, mucosa, and tongue normal; teeth and gums normal  Neck: No visible masses     Lungs:    respirations unlabored  Chest Wall:  No tenderness or deformity  Heart:  Regular rate and rhythm,  Abdomen:   Soft, non-tender,         Extremities: RUE: SKIN INTACT, FINGERS WARM WELL PERFUSED ABLE TO EXTEND WRIST AND THUMB GOOD DIGITAL MOTION  Pulses: 2+ and symmetric  Skin: Skin color, texture, turgor normal, no rashes or lesions     Neurologic: Normal    Assessment DISPLACED FRACTURE OF THE RIGHT RADIAL HEAD  Plan RIGHT RADIAL HEAD OPEN REDUCTION AND INTERNAL FIXATION WITH REPAIR AS INDICATED  R/B/A DISCUSSED WITH PT IN OFFICE.  PT VOICED UNDERSTANDING OF PLAN CONSENT SIGNED DAY OF SURGERY PT SEEN AND EXAMINED PRIOR TO OPERATIVE PROCEDURE/DAY OF SURGERY SITE MARKED. QUESTIONS ANSWERED WILL GO HOME FOLLOWING SURGERY  WE ARE PLANNING SURGERY FOR YOUR UPPER EXTREMITY. THE RISKS AND BENEFITS OF SURGERY INCLUDE BUT NOT LIMITED TO BLEEDING INFECTION, DAMAGE TO NEARBY NERVES ARTERIES TENDONS, FAILURE OF SURGERY TO ACCOMPLISH ITS INTENDED GOALS, PERSISTENT SYMPTOMS AND NEED FOR FURTHER SURGICAL INTERVENTION. WITH THIS IN MIND WE WILL PROCEED. I HAVE DISCUSSED WITH THE PATIENT THE PRE AND POSTOPERATIVE REGIMEN AND THE DOS AND DON'TS. PT VOICED UNDERSTANDING AND INFORMED CONSENT SIGNED.   Brynda Peon 11/25/2016, 11:51 AM

## 2016-11-26 ENCOUNTER — Ambulatory Visit (HOSPITAL_COMMUNITY): Payer: Medicare HMO | Admitting: Certified Registered Nurse Anesthetist

## 2016-11-26 ENCOUNTER — Ambulatory Visit (HOSPITAL_COMMUNITY)
Admission: RE | Admit: 2016-11-26 | Discharge: 2016-11-26 | Disposition: A | Discharge status: Home / Self Care | Payer: Medicare HMO | Source: Ambulatory Visit | Attending: Orthopedic Surgery | Admitting: Orthopedic Surgery

## 2016-11-26 ENCOUNTER — Encounter (HOSPITAL_COMMUNITY)
Admission: RE | Disposition: A | Discharge status: Home / Self Care | Payer: Self-pay | Source: Ambulatory Visit | Attending: Orthopedic Surgery

## 2016-11-26 ENCOUNTER — Encounter (HOSPITAL_COMMUNITY): Payer: Self-pay | Admitting: Certified Registered Nurse Anesthetist

## 2016-11-26 DIAGNOSIS — Z83511 Family history of glaucoma: Secondary | ICD-10-CM | POA: Diagnosis not present

## 2016-11-26 DIAGNOSIS — W19XXXA Unspecified fall, initial encounter: Secondary | ICD-10-CM | POA: Insufficient documentation

## 2016-11-26 DIAGNOSIS — Z833 Family history of diabetes mellitus: Secondary | ICD-10-CM | POA: Insufficient documentation

## 2016-11-26 DIAGNOSIS — Y939 Activity, unspecified: Secondary | ICD-10-CM | POA: Diagnosis not present

## 2016-11-26 DIAGNOSIS — S52121A Displaced fracture of head of right radius, initial encounter for closed fracture: Secondary | ICD-10-CM | POA: Diagnosis not present

## 2016-11-26 DIAGNOSIS — Z91018 Allergy to other foods: Secondary | ICD-10-CM | POA: Insufficient documentation

## 2016-11-26 DIAGNOSIS — S52121D Displaced fracture of head of right radius, subsequent encounter for closed fracture with routine healing: Secondary | ICD-10-CM | POA: Diagnosis not present

## 2016-11-26 DIAGNOSIS — Z8249 Family history of ischemic heart disease and other diseases of the circulatory system: Secondary | ICD-10-CM | POA: Diagnosis not present

## 2016-11-26 DIAGNOSIS — G8918 Other acute postprocedural pain: Secondary | ICD-10-CM | POA: Diagnosis not present

## 2016-11-26 DIAGNOSIS — M8580 Other specified disorders of bone density and structure, unspecified site: Secondary | ICD-10-CM | POA: Diagnosis not present

## 2016-11-26 HISTORY — DX: Other complications of anesthesia, initial encounter: T88.59XA

## 2016-11-26 HISTORY — PX: ORIF RADIAL FRACTURE: SHX5113

## 2016-11-26 HISTORY — DX: Adverse effect of unspecified anesthetic, initial encounter: T41.45XA

## 2016-11-26 LAB — CBC
HEMATOCRIT: 41.3 % (ref 36.0–46.0)
HEMOGLOBIN: 13.7 g/dL (ref 12.0–15.0)
MCH: 32.3 pg (ref 26.0–34.0)
MCHC: 33.2 g/dL (ref 30.0–36.0)
MCV: 97.4 fL (ref 78.0–100.0)
Platelets: 220 10*3/uL (ref 150–400)
RBC: 4.24 MIL/uL (ref 3.87–5.11)
RDW: 13 % (ref 11.5–15.5)
WBC: 5.4 10*3/uL (ref 4.0–10.5)

## 2016-11-26 SURGERY — OPEN REDUCTION INTERNAL FIXATION (ORIF) RADIAL FRACTURE
Anesthesia: General | Site: Elbow | Laterality: Right

## 2016-11-26 MED ORDER — CHLORHEXIDINE GLUCONATE 4 % EX LIQD: 60.0000 mL | Freq: Once | CUTANEOUS | Status: DC

## 2016-11-26 MED ORDER — ONDANSETRON HCL 4 MG/2ML IJ SOLN
INTRAMUSCULAR | Status: DC | PRN
  Administered 2016-11-26: 4 mg via INTRAVENOUS

## 2016-11-26 MED ORDER — FENTANYL CITRATE (PF) 100 MCG/2ML IJ SOLN
INTRAMUSCULAR | Status: DC | PRN
  Administered 2016-11-26: 75 ug via INTRAVENOUS
  Administered 2016-11-26: 100 ug via INTRAVENOUS
  Administered 2016-11-26: 25 ug via INTRAVENOUS

## 2016-11-26 MED ORDER — FENTANYL CITRATE (PF) 100 MCG/2ML IJ SOLN
INTRAMUSCULAR | Status: AC
  Administered 2016-11-26: 50 ug via INTRAVENOUS
  Filled 2016-11-26: qty 2

## 2016-11-26 MED ORDER — KETOROLAC TROMETHAMINE 30 MG/ML IJ SOLN
INTRAMUSCULAR | Status: AC
  Filled 2016-11-26: qty 1

## 2016-11-26 MED ORDER — MIDAZOLAM HCL 5 MG/5ML IJ SOLN
INTRAMUSCULAR | Status: DC | PRN
  Administered 2016-11-26: 2 mg via INTRAVENOUS

## 2016-11-26 MED ORDER — LIDOCAINE HCL (CARDIAC) 20 MG/ML IV SOLN
INTRAVENOUS | Status: DC | PRN
  Administered 2016-11-26: 100 mg via INTRAVENOUS

## 2016-11-26 MED ORDER — KETOROLAC TROMETHAMINE 30 MG/ML IJ SOLN
30.0000 mg | Freq: Once | INTRAMUSCULAR | Status: DC | PRN
  Administered 2016-11-26: 30 mg via INTRAVENOUS

## 2016-11-26 MED ORDER — EPHEDRINE 5 MG/ML INJ
INTRAVENOUS | Status: AC
  Filled 2016-11-26: qty 10

## 2016-11-26 MED ORDER — PROPOFOL 10 MG/ML IV BOLUS
INTRAVENOUS | Status: AC
  Filled 2016-11-26: qty 20

## 2016-11-26 MED ORDER — ONDANSETRON HCL 4 MG/2ML IJ SOLN
INTRAMUSCULAR | Status: AC
  Filled 2016-11-26: qty 2

## 2016-11-26 MED ORDER — CEFAZOLIN SODIUM-DEXTROSE 2-4 GM/100ML-% IV SOLN
2.0000 g | INTRAVENOUS | Status: AC
  Administered 2016-11-26: 2 g via INTRAVENOUS
  Filled 2016-11-26: qty 100

## 2016-11-26 MED ORDER — LACTATED RINGERS IV SOLN: INTRAVENOUS | Status: DC

## 2016-11-26 MED ORDER — ROPIVACAINE HCL 5 MG/ML IJ SOLN
INTRAMUSCULAR | Status: DC | PRN
  Administered 2016-11-26: 20 mL via PERINEURAL

## 2016-11-26 MED ORDER — MEPERIDINE HCL 25 MG/ML IJ SOLN
6.2500 mg | INTRAMUSCULAR | Status: DC | PRN
  Administered 2016-11-26: 6.25 mg via INTRAVENOUS

## 2016-11-26 MED ORDER — OXYCODONE HCL 5 MG/5ML PO SOLN: 5.0000 mg | Freq: Once | ORAL | Status: AC | PRN

## 2016-11-26 MED ORDER — PROPOFOL 10 MG/ML IV BOLUS
INTRAVENOUS | Status: DC | PRN
  Administered 2016-11-26: 200 mg via INTRAVENOUS

## 2016-11-26 MED ORDER — PHENYLEPHRINE 40 MCG/ML (10ML) SYRINGE FOR IV PUSH (FOR BLOOD PRESSURE SUPPORT)
PREFILLED_SYRINGE | INTRAVENOUS | Status: AC
  Filled 2016-11-26: qty 10

## 2016-11-26 MED ORDER — ACETAMINOPHEN 325 MG PO TABS: 325.0000 mg | ORAL_TABLET | ORAL | Status: DC | PRN

## 2016-11-26 MED ORDER — OXYCODONE HCL 5 MG PO TABS
5.0000 mg | ORAL_TABLET | Freq: Once | ORAL | Status: AC | PRN
  Administered 2016-11-26: 5 mg via ORAL

## 2016-11-26 MED ORDER — FENTANYL CITRATE (PF) 100 MCG/2ML IJ SOLN
INTRAMUSCULAR | Status: AC
  Administered 2016-11-26: 100 ug
  Filled 2016-11-26: qty 2

## 2016-11-26 MED ORDER — MIDAZOLAM HCL 2 MG/2ML IJ SOLN
INTRAMUSCULAR | Status: AC
  Filled 2016-11-26: qty 2

## 2016-11-26 MED ORDER — MEPERIDINE HCL 25 MG/ML IJ SOLN
INTRAMUSCULAR | Status: DC
Start: 2016-11-26 — End: 2016-11-26
  Filled 2016-11-26: qty 1

## 2016-11-26 MED ORDER — LACTATED RINGERS IV SOLN
INTRAVENOUS | Status: DC | PRN
  Administered 2016-11-26: 09:00:00 via INTRAVENOUS

## 2016-11-26 MED ORDER — OXYCODONE HCL 5 MG PO TABS
ORAL_TABLET | ORAL | Status: AC
  Administered 2016-11-26: 5 mg via ORAL
  Filled 2016-11-26: qty 1

## 2016-11-26 MED ORDER — BUPIVACAINE HCL (PF) 0.25 % IJ SOLN
INTRAMUSCULAR | Status: DC | PRN
  Administered 2016-11-26: 10 mL

## 2016-11-26 MED ORDER — MIDAZOLAM HCL 2 MG/2ML IJ SOLN
INTRAMUSCULAR | Status: AC
  Administered 2016-11-26: 2 mg
  Filled 2016-11-26: qty 2

## 2016-11-26 MED ORDER — FENTANYL CITRATE (PF) 250 MCG/5ML IJ SOLN
INTRAMUSCULAR | Status: AC
  Filled 2016-11-26: qty 5

## 2016-11-26 MED ORDER — ACETAMINOPHEN 160 MG/5ML PO SOLN: 325.0000 mg | ORAL | Status: DC | PRN

## 2016-11-26 MED ORDER — ONDANSETRON HCL 4 MG/2ML IJ SOLN: 4.0000 mg | Freq: Once | INTRAMUSCULAR | Status: DC | PRN

## 2016-11-26 MED ORDER — DEXAMETHASONE SODIUM PHOSPHATE 10 MG/ML IJ SOLN
INTRAMUSCULAR | Status: DC | PRN
  Administered 2016-11-26: 10 mg via INTRAVENOUS

## 2016-11-26 MED ORDER — 0.9 % SODIUM CHLORIDE (POUR BTL) OPTIME
TOPICAL | Status: DC | PRN
  Administered 2016-11-26: 1000 mL

## 2016-11-26 MED ORDER — FENTANYL CITRATE (PF) 100 MCG/2ML IJ SOLN
25.0000 ug | INTRAMUSCULAR | Status: DC | PRN
  Administered 2016-11-26 (×2): 50 ug via INTRAVENOUS

## 2016-11-26 MED ORDER — LIDOCAINE 2% (20 MG/ML) 5 ML SYRINGE
INTRAMUSCULAR | Status: AC
  Filled 2016-11-26: qty 5

## 2016-11-26 SURGICAL SUPPLY — 52 items
BANDAGE ACE 4X5 VEL STRL LF (GAUZE/BANDAGES/DRESSINGS) ×1 IMPLANT
BANDAGE ELASTIC 3 VELCRO ST LF (GAUZE/BANDAGES/DRESSINGS) IMPLANT
BIT DRILL 1.8 CANN MAX VPC (BIT) ×1 IMPLANT
BNDG CMPR 9X4 STRL LF SNTH (GAUZE/BANDAGES/DRESSINGS) ×1
BNDG COHESIVE 4X5 TAN STRL (GAUZE/BANDAGES/DRESSINGS) ×2 IMPLANT
BNDG ESMARK 4X9 LF (GAUZE/BANDAGES/DRESSINGS) ×2 IMPLANT
BNDG GAUZE ELAST 4 BULKY (GAUZE/BANDAGES/DRESSINGS) ×1 IMPLANT
CORDS BIPOLAR (ELECTRODE) ×2 IMPLANT
COVER MAYO STAND STRL (DRAPES) ×2 IMPLANT
COVER SURGICAL LIGHT HANDLE (MISCELLANEOUS) ×2 IMPLANT
CUFF TOURNIQUET SINGLE 18IN (TOURNIQUET CUFF) ×2 IMPLANT
CUFF TOURNIQUET SINGLE 24IN (TOURNIQUET CUFF) IMPLANT
DRAPE INCISE IOBAN 66X45 STRL (DRAPES) ×2 IMPLANT
DRAPE OEC MINIVIEW 54X84 (DRAPES) ×1 IMPLANT
DRSG ADAPTIC 3X8 NADH LF (GAUZE/BANDAGES/DRESSINGS) ×1 IMPLANT
DRSG PAD ABDOMINAL 8X10 ST (GAUZE/BANDAGES/DRESSINGS) ×1 IMPLANT
GAUZE SPONGE 4X4 12PLY STRL (GAUZE/BANDAGES/DRESSINGS) ×1 IMPLANT
GLOVE BIOGEL PI IND STRL 8.5 (GLOVE) ×1 IMPLANT
GLOVE BIOGEL PI INDICATOR 8.5 (GLOVE) ×1
GLOVE SURG ORTHO 8.0 STRL STRW (GLOVE) ×2 IMPLANT
GOWN STRL REUS W/ TWL LRG LVL3 (GOWN DISPOSABLE) ×2 IMPLANT
GOWN STRL REUS W/ TWL XL LVL3 (GOWN DISPOSABLE) ×1 IMPLANT
GOWN STRL REUS W/TWL LRG LVL3 (GOWN DISPOSABLE) ×4
GOWN STRL REUS W/TWL XL LVL3 (GOWN DISPOSABLE) ×2
K-WIRE COCR 0.9X95 (WIRE) ×6
KIT BASIN OR (CUSTOM PROCEDURE TRAY) ×2 IMPLANT
KIT ROOM TURNOVER OR (KITS) ×2 IMPLANT
KWIRE COCR 0.9X95 (WIRE) IMPLANT
LOOP VESSEL MAXI BLUE (MISCELLANEOUS) IMPLANT
MANIFOLD NEPTUNE II (INSTRUMENTS) ×2 IMPLANT
NDL HYPO 25GX1X1/2 BEV (NEEDLE) IMPLANT
NEEDLE HYPO 25GX1X1/2 BEV (NEEDLE) ×2 IMPLANT
NS IRRIG 1000ML POUR BTL (IV SOLUTION) ×2 IMPLANT
PACK ORTHO EXTREMITY (CUSTOM PROCEDURE TRAY) ×2 IMPLANT
PAD ARMBOARD 7.5X6 YLW CONV (MISCELLANEOUS) ×4 IMPLANT
PAD CAST 4YDX4 CTTN HI CHSV (CAST SUPPLIES) IMPLANT
PADDING CAST COTTON 4X4 STRL (CAST SUPPLIES) ×2
SCREW VPC 2.5X16MM (Screw) ×2 IMPLANT
SOAP 2 % CHG 4 OZ (WOUND CARE) ×2 IMPLANT
SPECIMEN JAR SMALL (MISCELLANEOUS) ×2 IMPLANT
SUCTION FRAZIER HANDLE 10FR (MISCELLANEOUS) ×1
SUCTION TUBE FRAZIER 10FR DISP (MISCELLANEOUS) IMPLANT
SUT MERSILENE 4 0 P 3 (SUTURE) IMPLANT
SUT PROLENE 4 0 PS 2 18 (SUTURE) ×1 IMPLANT
SUT VIC AB 2-0 CT1 27 (SUTURE) ×2
SUT VIC AB 2-0 CT1 TAPERPNT 27 (SUTURE) IMPLANT
SYR CONTROL 10ML LL (SYRINGE) ×1 IMPLANT
TOWEL OR 17X24 6PK STRL BLUE (TOWEL DISPOSABLE) ×2 IMPLANT
TOWEL OR 17X26 10 PK STRL BLUE (TOWEL DISPOSABLE) ×4 IMPLANT
TUBE CONNECTING 12X1/4 (SUCTIONS) ×2 IMPLANT
UNDERPAD 30X30 (UNDERPADS AND DIAPERS) ×2 IMPLANT
WATER STERILE IRR 1000ML POUR (IV SOLUTION) ×2 IMPLANT

## 2016-11-26 NOTE — Anesthesia Postprocedure Evaluation (Addendum)
Anesthesia Post Note  Patient: Mandy Willis  Procedure(s) Performed: Procedure(s) (LRB): Right elbow open reduction and internal fixation and repair as indicated (Right)  Patient location during evaluation: PACU Anesthesia Type: General Level of consciousness: awake Pain management: pain level controlled Respiratory status: spontaneous breathing Cardiovascular status: stable Postop Assessment: no signs of nausea or vomiting Anesthetic complications: no        Last Vitals:  Vitals:   11/26/16 1200 11/26/16 1215  BP: 117/67 127/81  Pulse: 71 76  Resp: (!) 9 16  Temp:      Last Pain:  Vitals:   11/26/16 1200  TempSrc:   PainSc: 8    Pain Goal: Patients Stated Pain Goal: 3 (11/26/16 0714)               Mandy Willis,JOHN Mateo Flow

## 2016-11-26 NOTE — Anesthesia Procedure Notes (Addendum)
Anesthesia Regional Block: Supraclavicular block   Pre-Anesthetic Checklist: ,, timeout performed, Correct Patient, Correct Site, Correct Laterality, Correct Procedure, Correct Position, site marked, Risks and benefits discussed,  Surgical consent,  Pre-op evaluation,  At surgeon's request and post-op pain management  Laterality: Right and Upper  Prep: Dura Prep       Needles:  Injection technique: Single-shot  Needle Type: Echogenic Stimulator Needle     Needle Length: 5cm  Needle Gauge: 21   Needle insertion depth: 4 cm   Additional Needles:   Procedures: ultrasound guided,,,,,,,,  Narrative:  Start time: 11/26/2016 9:00 AM End time: 11/26/2016 9:10 AM Injection made incrementally with aspirations every 5 mL.  Performed by: Personally  Anesthesiologist: Lyn Hollingshead

## 2016-11-26 NOTE — Anesthesia Preprocedure Evaluation (Signed)
Anesthesia Evaluation  Patient identified by MRN, date of birth, ID band Patient awake    Reviewed: Allergy & Precautions, NPO status , Patient's Chart, lab work & pertinent test results  History of Anesthesia Complications (+) PROLONGED EMERGENCE  Airway Mallampati: I       Dental no notable dental hx.    Pulmonary neg pulmonary ROS,    Pulmonary exam normal        Cardiovascular Normal cardiovascular exam     Neuro/Psych negative neurological ROS  negative psych ROS   GI/Hepatic negative GI ROS, Neg liver ROS,   Endo/Other  negative endocrine ROS  Renal/GU negative Renal ROS  negative genitourinary   Musculoskeletal   Abdominal Normal abdominal exam  (+)   Peds  Hematology negative hematology ROS (+)   Anesthesia Other Findings   Reproductive/Obstetrics negative OB ROS                             Anesthesia Physical Anesthesia Plan  ASA: I  Anesthesia Plan: General   Post-op Pain Management:  Regional for Post-op pain   Induction: Intravenous  Airway Management Planned: LMA  Additional Equipment:   Intra-op Plan:   Post-operative Plan:   Informed Consent: I have reviewed the patients History and Physical, chart, labs and discussed the procedure including the risks, benefits and alternatives for the proposed anesthesia with the patient or authorized representative who has indicated his/her understanding and acceptance.     Plan Discussed with: CRNA and Surgeon  Anesthesia Plan Comments:         Anesthesia Quick Evaluation

## 2016-11-26 NOTE — Transfer of Care (Signed)
Immediate Anesthesia Transfer of Care Note  Patient: Mandy Willis  Procedure(s) Performed: Procedure(s): Right elbow open reduction and internal fixation and repair as indicated (Right)  Patient Location: PACU  Anesthesia Type:GA combined with regional for post-op pain  Level of Consciousness: awake, alert  and oriented  Airway & Oxygen Therapy: Patient Spontanous Breathing and Patient connected to nasal cannula oxygen  Post-op Assessment: Report given to RN and Post -op Vital signs reviewed and stable  Post vital signs: Reviewed and stable  Last Vitals:  Vitals:   11/26/16 0918 11/26/16 1100  BP: 119/68 127/76  Pulse: 61 84  Resp: (!) 9 (!) 8  Temp:  36.4 C    Last Pain:  Vitals:   11/26/16 0725  TempSrc: Oral      Patients Stated Pain Goal: 3 (34/35/68 6168)  Complications: No apparent anesthesia complications

## 2016-11-26 NOTE — Brief Op Note (Signed)
ORIF RIGHT RADIAL HEAD HOME TODAY JOB ID [id number] F/U IN OFFICE IN 10 DAYS

## 2016-11-26 NOTE — Anesthesia Procedure Notes (Signed)
Procedure Name: LMA Insertion Date/Time: 11/26/2016 9:36 AM Performed by: Ollen Bowl Pre-anesthesia Checklist: Patient identified, Emergency Drugs available, Suction available, Patient being monitored and Timeout performed Patient Re-evaluated:Patient Re-evaluated prior to inductionOxygen Delivery Method: Circle system utilized and Simple face mask Preoxygenation: Pre-oxygenation with 100% oxygen Intubation Type: IV induction Ventilation: Mask ventilation without difficulty LMA: LMA inserted LMA Size: 5.0 Number of attempts: 1 Airway Equipment and Method: Patient positioned with wedge pillow Placement Confirmation: positive ETCO2 and breath sounds checked- equal and bilateral Tube secured with: Tape Dental Injury: Teeth and Oropharynx as per pre-operative assessment

## 2016-11-26 NOTE — Discharge Instructions (Signed)
KEEP BANDAGE CLEAN AND DRY CALL OFFICE FOR F/U APPT [phone] IN 10 DAYS DR Caralyn Guile [phone] KEEP HAND ELEVATED ABOVE HEART OK TO APPLY ICE TO OPERATIVE AREA CONTACT OFFICE IF ANY WORSENING PAIN OR CONCERNS.

## 2016-11-29 ENCOUNTER — Encounter (HOSPITAL_COMMUNITY): Payer: Self-pay | Admitting: Orthopedic Surgery

## 2016-11-29 NOTE — Op Note (Signed)
NAME:  Mandy Willis                     ACCOUNT NO.:  MEDICAL RECORD NO.:  [id number]  LOCATION:                                 FACILITY:  PHYSICIAN:  Melrose Nakayama, M.D.    DATE OF BIRTH:  DATE OF PROCEDURE:  11/26/2016 DATE OF DISCHARGE:                              OPERATIVE REPORT   PREOPERATIVE DIAGNOSIS:  Type 2 radial head fracture, displaced, right.  POSTOPERATIVE DIAGNOSIS:  Type 2 radial head fracture, displaced, right.  ATTENDING PHYSICIAN:  Linna Hoff, M.D., scrubbed and present for the entire procedure.  ASSISTANT SURGEON:  None.  ANESTHESIA:  General via LMA.  PROCEDURE: 1. Open reduction and internal fixation of displaced intra-articular     proximal radius fracture of radial head. 2. Radiographs 3 views, right elbow.  RADIOGRAPHIC INTERPRETATION:  AP, lateral, and oblique views of the elbow do show the radial head screw fixation in place with good joint alignment.  SURGICAL IMPLANTS:  Biomet headless compression screws 2.5 mm x 16 mm in length.  SURGICAL INDICATIONS:  Mrs. Mandy Willis is a 66 year old right-hand dominant female with a type 2 displaced radial head fracture.  It was recommended she undergo the above procedure.  Risks, benefits, and alternatives were discussed in detail with the patient.  Signed informed consent was obtained.  Risks include, but not limited to bleeding, infection, damage to nearby nerves, arteries, or tendons; loss of motion at the wrists and digits, incomplete relief of symptoms, and need for further surgical intervention.  DESCRIPTION OF PROCEDURE:  The patient was properly identified in the preoperative holding area, and marked with a permanent marker made on the right elbow to indicate the correct operative site.  The patient was brought back to the operating room, placed supine on the anesthesia room table.  General anesthesia was administered.  The patient tolerated this well.  A well-padded tourniquet placed  on the right brachium and sealed with 1000 drape.  The right upper extremity was then prepped and draped in normal sterile fashion.  Time-out was called, correct side was identified, and the procedure begun.  Attention was then turned to the right elbow, a curvilinear incision made directly over the lateral aspect of the elbow and dissection carried down through the skin and subcutaneous tissue.  The extensor interval was then incised longitudinally and then the joint capsule was then opened up and the large joint effusion was evacuated.  Once this was carried out, the fracture hematoma was then evacuated.  The radial head was then carefully elevated using small Freer elevators and a dental pick.  After elevation of the fracture site, the head reduced nicely.  The patient __________deformation as well as some comminution as well as small fragments within the joint that were not present or cartilaginous loss. Open reduction was then performed.  The K-wires were then used with a Biomet 2.5 mm headless compression screws and 2 screws were then placed. The wound was then irrigated.  Final radiographs were then obtained. The capsule was then closed with Vicryl, its extensor interval closed with 2-0 Vicryl.  Subcutaneous tissues closed with 4-0 Vicryl and skin closed with running 4-0  Prolene.  Steri-Strips were applied.  An Adaptic dressing and sterile compressive bandage were applied.  The patient was placed back in a long-arm splint, extubated, and taken to recovery room in good condition.  POSTPROCEDURE PLAN:  The patient was discharged to home, see me back in my office in approximately 10 days for wound check.  X-rays, 3 views of the elbow, and then begin an outpatient therapy regimen.  Radiographs at each visit.     Melrose Nakayama, M.D.     FWO/MEDQ  D:  11/26/2016  T:  11/26/2016  Job:  [id number]

## 2016-12-02 DIAGNOSIS — Z0101 Encounter for examination of eyes and vision with abnormal findings: Secondary | ICD-10-CM | POA: Diagnosis not present

## 2016-12-07 DIAGNOSIS — S52121D Displaced fracture of head of right radius, subsequent encounter for closed fracture with routine healing: Secondary | ICD-10-CM | POA: Diagnosis not present

## 2016-12-07 DIAGNOSIS — Z4789 Encounter for other orthopedic aftercare: Secondary | ICD-10-CM | POA: Diagnosis not present

## 2016-12-07 DIAGNOSIS — M25621 Stiffness of right elbow, not elsewhere classified: Secondary | ICD-10-CM | POA: Diagnosis not present

## 2016-12-15 DIAGNOSIS — M25621 Stiffness of right elbow, not elsewhere classified: Secondary | ICD-10-CM | POA: Diagnosis not present

## 2016-12-22 DIAGNOSIS — M25621 Stiffness of right elbow, not elsewhere classified: Secondary | ICD-10-CM | POA: Diagnosis not present

## 2016-12-29 DIAGNOSIS — M25621 Stiffness of right elbow, not elsewhere classified: Secondary | ICD-10-CM | POA: Diagnosis not present

## 2016-12-31 DIAGNOSIS — S52121D Displaced fracture of head of right radius, subsequent encounter for closed fracture with routine healing: Secondary | ICD-10-CM | POA: Diagnosis not present

## 2017-01-05 DIAGNOSIS — M25621 Stiffness of right elbow, not elsewhere classified: Secondary | ICD-10-CM | POA: Diagnosis not present

## 2017-01-12 DIAGNOSIS — M25621 Stiffness of right elbow, not elsewhere classified: Secondary | ICD-10-CM | POA: Diagnosis not present

## 2017-01-19 DIAGNOSIS — M25621 Stiffness of right elbow, not elsewhere classified: Secondary | ICD-10-CM | POA: Diagnosis not present

## 2017-01-26 DIAGNOSIS — M25621 Stiffness of right elbow, not elsewhere classified: Secondary | ICD-10-CM | POA: Diagnosis not present

## 2017-02-01 DIAGNOSIS — S52121D Displaced fracture of head of right radius, subsequent encounter for closed fracture with routine healing: Secondary | ICD-10-CM | POA: Diagnosis not present

## 2017-02-02 DIAGNOSIS — M25621 Stiffness of right elbow, not elsewhere classified: Secondary | ICD-10-CM | POA: Diagnosis not present

## 2017-02-04 NOTE — Addendum Note (Signed)
Addendum  created 02/04/17 1131 by Lyn Hollingshead, MD   Sign clinical note

## 2017-02-09 DIAGNOSIS — M25621 Stiffness of right elbow, not elsewhere classified: Secondary | ICD-10-CM | POA: Diagnosis not present

## 2017-02-16 DIAGNOSIS — M25621 Stiffness of right elbow, not elsewhere classified: Secondary | ICD-10-CM | POA: Diagnosis not present

## 2017-02-17 DIAGNOSIS — R69 Illness, unspecified: Secondary | ICD-10-CM | POA: Diagnosis not present

## 2017-03-15 DIAGNOSIS — S52121D Displaced fracture of head of right radius, subsequent encounter for closed fracture with routine healing: Secondary | ICD-10-CM | POA: Diagnosis not present

## 2017-08-31 DIAGNOSIS — Z0100 Encounter for examination of eyes and vision without abnormal findings: Secondary | ICD-10-CM | POA: Diagnosis not present

## 2017-09-06 DIAGNOSIS — R69 Illness, unspecified: Secondary | ICD-10-CM | POA: Diagnosis not present

## 2018-03-14 DIAGNOSIS — R69 Illness, unspecified: Secondary | ICD-10-CM | POA: Diagnosis not present

## 2018-06-14 DIAGNOSIS — M25512 Pain in left shoulder: Secondary | ICD-10-CM | POA: Diagnosis not present

## 2018-09-11 DIAGNOSIS — Z0100 Encounter for examination of eyes and vision without abnormal findings: Secondary | ICD-10-CM | POA: Diagnosis not present

## 2018-09-13 DIAGNOSIS — R69 Illness, unspecified: Secondary | ICD-10-CM | POA: Diagnosis not present

## 2018-10-04 ENCOUNTER — Telehealth: Payer: Self-pay | Admitting: *Deleted

## 2018-10-04 NOTE — Telephone Encounter (Signed)
Patient has annual exam scheduled on 11/22/18 will need to have flu shot and Tdap injection prior to appointment. Nurse injection schedule.

## 2018-10-09 ENCOUNTER — Ambulatory Visit (INDEPENDENT_AMBULATORY_CARE_PROVIDER_SITE_OTHER): Payer: Medicare HMO | Admitting: Gynecology

## 2018-10-09 DIAGNOSIS — Z23 Encounter for immunization: Secondary | ICD-10-CM

## 2018-10-27 DIAGNOSIS — D3132 Benign neoplasm of left choroid: Secondary | ICD-10-CM | POA: Diagnosis not present

## 2018-10-27 DIAGNOSIS — H43813 Vitreous degeneration, bilateral: Secondary | ICD-10-CM | POA: Diagnosis not present

## 2018-10-27 DIAGNOSIS — H2513 Age-related nuclear cataract, bilateral: Secondary | ICD-10-CM | POA: Diagnosis not present

## 2018-11-20 ENCOUNTER — Other Ambulatory Visit: Payer: Self-pay

## 2018-11-22 ENCOUNTER — Encounter: Payer: Medicare HMO | Admitting: Gynecology

## 2018-11-24 ENCOUNTER — Other Ambulatory Visit: Payer: Self-pay

## 2018-11-28 ENCOUNTER — Encounter: Payer: Self-pay | Admitting: Gynecology

## 2018-11-28 ENCOUNTER — Other Ambulatory Visit: Payer: Self-pay

## 2018-11-28 ENCOUNTER — Ambulatory Visit: Payer: Medicare HMO | Admitting: Gynecology

## 2018-11-28 VITALS — BP 116/74 | Ht 66.0 in | Wt 189.0 lb

## 2018-11-28 DIAGNOSIS — Z01419 Encounter for gynecological examination (general) (routine) without abnormal findings: Secondary | ICD-10-CM

## 2018-11-28 DIAGNOSIS — N952 Postmenopausal atrophic vaginitis: Secondary | ICD-10-CM | POA: Diagnosis not present

## 2018-11-28 DIAGNOSIS — Z124 Encounter for screening for malignant neoplasm of cervix: Secondary | ICD-10-CM

## 2018-11-28 DIAGNOSIS — Z9189 Other specified personal risk factors, not elsewhere classified: Secondary | ICD-10-CM | POA: Diagnosis not present

## 2018-11-28 DIAGNOSIS — M8580 Other specified disorders of bone density and structure, unspecified site: Secondary | ICD-10-CM | POA: Diagnosis not present

## 2018-11-28 NOTE — Progress Notes (Signed)
    NOELI LAVERY 06-10-1951 [id number]        68 y.o.  G2P2 for breast and pelvic exam.  Without gynecologic complaints  Past medical history,surgical history, problem list, medications, allergies, family history and social history were all reviewed and documented as reviewed in the EPIC chart.  ROS:  Performed with pertinent positives and negatives included in the history, assessment and plan.   Additional significant findings : None   Exam: [name] assistant Vitals:   11/28/18 0834  BP: 116/74  Weight: 189 lb (85.7 kg)  Height: 5\' 6"  (1.676 m)   Body mass index is 30.51 kg/m.  General appearance:  Normal affect, orientation and appearance. Skin: Grossly normal HEENT: Without gross lesions.  No cervical or supraclavicular adenopathy. Thyroid normal.  Lungs:  Clear without wheezing, rales or rhonchi Cardiac: RR, without RMG Abdominal:  Soft, nontender, without masses, guarding, rebound, organomegaly or hernia Breasts:  Examined lying and sitting without masses, retractions, discharge or axillary adenopathy. Pelvic:  Ext, BUS, Vagina: With atrophic changes  Cervix: With atrophic changes.  Pap smear done  Uterus: Anteverted, normal size, shape and contour, midline and mobile nontender   Adnexa: Without masses or tenderness    Anus and perineum: Normal   Rectovaginal: Normal sphincter tone without palpated masses or tenderness.    Assessment/Plan:  68 y.o. G2P2 female for breast and pelvic exam  1. Postmenopausal/atrophic genital changes.  No significant menopausal symptoms or any vaginal bleeding. 2. Osteopenia.  DEXA 2017 T score -1.9 FRAX 8% / 1%.  Recommend scheduling follow-up DEXA now and she agrees to do so. 3. Pap smear 2017.  Pap smear done today.  No history of abnormal Pap smears.  Options to stop screening per current screening guidelines based on age discussed.  Will readdress on an annual basis. 4. Mammography 2017.  Recommend screening mammogram and she  agrees to call and schedule.  Breast exam normal today. 5. Colonoscopy reported 2006.  She plans to discuss with Dr. Collene Mares colon screening recommendations.  She wants to discuss whether Cologuard could replace the colonoscopy and I encouraged her to discuss this with Dr. Collene Mares. 6. Health maintenance.  No routine lab work done as patient does this elsewhere.  Follow-up 1 year, sooner as needed.   [name] MD, 9:02 AM 11/28/2018

## 2018-11-28 NOTE — Patient Instructions (Signed)
Schedule your mammogram  Discussed with Dr. Collene Mares about colon screening  Follow-up in 1 year for annual exam

## 2018-11-28 NOTE — Addendum Note (Signed)
Addended by: Nelva Nay on: 11/28/2018 09:31 AM   Modules accepted: Orders

## 2018-11-29 LAB — PAP IG W/ RFLX HPV ASCU

## 2019-03-09 DIAGNOSIS — M5416 Radiculopathy, lumbar region: Secondary | ICD-10-CM | POA: Diagnosis not present

## 2019-03-09 DIAGNOSIS — M9903 Segmental and somatic dysfunction of lumbar region: Secondary | ICD-10-CM | POA: Diagnosis not present

## 2019-06-06 ENCOUNTER — Encounter: Payer: Self-pay | Admitting: Gynecology

## 2019-07-31 ENCOUNTER — Other Ambulatory Visit: Payer: Self-pay | Admitting: Gynecology

## 2019-07-31 DIAGNOSIS — Z1231 Encounter for screening mammogram for malignant neoplasm of breast: Secondary | ICD-10-CM

## 2019-08-11 ENCOUNTER — Ambulatory Visit: Payer: Self-pay

## 2019-09-24 DIAGNOSIS — Z1211 Encounter for screening for malignant neoplasm of colon: Secondary | ICD-10-CM | POA: Diagnosis not present

## 2019-09-24 DIAGNOSIS — Z20828 Contact with and (suspected) exposure to other viral communicable diseases: Secondary | ICD-10-CM | POA: Diagnosis not present

## 2019-09-24 DIAGNOSIS — Z90710 Acquired absence of both cervix and uterus: Secondary | ICD-10-CM | POA: Diagnosis not present

## 2019-10-16 DIAGNOSIS — Z0100 Encounter for examination of eyes and vision without abnormal findings: Secondary | ICD-10-CM | POA: Diagnosis not present

## 2019-10-17 DIAGNOSIS — Z0100 Encounter for examination of eyes and vision without abnormal findings: Secondary | ICD-10-CM | POA: Diagnosis not present

## 2019-10-24 DIAGNOSIS — R69 Illness, unspecified: Secondary | ICD-10-CM | POA: Diagnosis not present

## 2020-02-18 DIAGNOSIS — R32 Unspecified urinary incontinence: Secondary | ICD-10-CM | POA: Diagnosis not present

## 2020-02-18 DIAGNOSIS — Z780 Asymptomatic menopausal state: Secondary | ICD-10-CM | POA: Diagnosis not present

## 2020-02-18 DIAGNOSIS — Z6829 Body mass index (BMI) 29.0-29.9, adult: Secondary | ICD-10-CM | POA: Diagnosis not present

## 2020-02-20 ENCOUNTER — Other Ambulatory Visit: Payer: Self-pay | Admitting: Family Medicine

## 2020-02-20 ENCOUNTER — Other Ambulatory Visit: Payer: Self-pay | Admitting: Gynecology

## 2020-02-20 DIAGNOSIS — R5381 Other malaise: Secondary | ICD-10-CM

## 2020-02-21 ENCOUNTER — Other Ambulatory Visit: Payer: Self-pay | Admitting: Family Medicine

## 2020-02-21 DIAGNOSIS — E2839 Other primary ovarian failure: Secondary | ICD-10-CM

## 2020-03-24 ENCOUNTER — Ambulatory Visit: Payer: Medicare HMO | Attending: Family Medicine | Admitting: Physical Therapy

## 2020-03-24 ENCOUNTER — Encounter: Payer: Self-pay | Admitting: Physical Therapy

## 2020-03-24 ENCOUNTER — Other Ambulatory Visit: Payer: Self-pay

## 2020-03-24 DIAGNOSIS — M6281 Muscle weakness (generalized): Secondary | ICD-10-CM

## 2020-03-24 DIAGNOSIS — R279 Unspecified lack of coordination: Secondary | ICD-10-CM | POA: Insufficient documentation

## 2020-03-24 DIAGNOSIS — R252 Cramp and spasm: Secondary | ICD-10-CM

## 2020-03-24 NOTE — Patient Instructions (Addendum)
Breathing during bowel movement:  1. Back straight - sit up low back to upper back not rounding forward - look straight ahead 2. Lean forward - only as far as possible with back staying straight 3. Breathe - slow as you can inhaling down into your belly and feeling pressure on the pelvic floor 4. Hard belly - keep belly like a hard ball on the max inhale 5. Blow hard - like blowing up a balloon and pressure down into pelvic floor 6. Squeeze and lift - tighten pelvic floor after BM to reset everything back into place  Position with feet up on stool/squatty potty  [facility] [address] [place], [place] [address] Phone # [phone] Fax [phone]  Access Code: [email or web address] URL: [email or web address] Date: 03/24/2020 Prepared by: Jari Favre  Exercises Supine Diaphragmatic Breathing - 3 x daily - 7 x weekly - 10 reps - 1 sets Supine Pelvic Floor Stretch - 1 x daily - 7 x weekly - 3 reps - 1 sets - 30 sec hold Hooklying Hamstring Stretch with Doorway - 1 x daily - 7 x weekly - 3 sets - 10 reps Seated Hamstring Stretch - 1 x daily - 7 x weekly - 3 reps - 1 sets - 30 sec hold Supine Figure 4 Piriformis Stretch - 1 x daily - 7 x weekly - 3 reps - 1 sets - 30 sec hold Supine Figure 4 Piriformis Stretch - 1 x daily - 7 x weekly - 3 sets - 10 reps

## 2020-03-24 NOTE — Therapy (Signed)
[facility] [address], [place], [place], [address] Phone: [phone]   Fax:  [phone]  Physical Therapy Evaluation  Patient Details  Name: Mandy Willis MRN: [id number] Date of Birth: 1951-01-07 Referring Provider (PT): Kathyrn Lass, MD   Encounter Date: 03/24/2020   PT End of Session - 03/24/20 0954    Visit Number 1    Date for PT Re-Evaluation 06/16/20    PT Start Time 0801    PT Stop Time 0841    PT Time Calculation (min) 40 min    Activity Tolerance Patient tolerated treatment well    Behavior During Therapy [facility] for tasks assessed/performed           Past Medical History:  Diagnosis Date  . Complication of anesthesia    slow to awaken after Colonoscopy, "wipe out for 1 day and half after Tubal Ligation"  . Osteopenia 11/2015   T score -1.9 FRAX 8%/1%    Past Surgical History:  Procedure Laterality Date  . COLONOSCOPY    . COLONOSCOPY W/ POLYPECTOMY    . ORIF RADIAL FRACTURE Right 11/26/2016   Procedure: Right elbow open reduction and internal fixation and repair as indicated;  Surgeon: Iran Planas, MD;  Location: [place];  Service: Orthopedics;  Laterality: Right;  . TUBAL LIGATION    . WISDOM TOOTH EXTRACTION      There were no vitals filed for this visit.    Subjective Assessment - 03/24/20 0803    Subjective Pt has had leakage for a while and wearing pad for at least 5 years.  Pt states leakage mostly happens when walking especially walking fast.  Pt drinks 64oz/day water most days. Regular underwear can last 6 hours and change due to wetness.  Nocturia is usually 1x/night and no leakage.    Limitations Walking    Patient Stated Goals be able to walk without having leakage and not have to use depends/absorbant underwear    Currently in Pain? No/denies              [facility] PT Assessment - 03/24/20 0001      Assessment   Medical Diagnosis R32 (ICD-10-CM) - Unspecified urinary  incontinence    Referring Provider (PT) Kathyrn Lass, MD    Prior Therapy No      Precautions   Precautions None      Balance Screen   Has the patient fallen in the past 6 months No      [place] residence    Living Arrangements Spouse/significant other      Prior Function   Level of [place] Part time employment    Leisure yard work      Cognition   Overall Cognitive Status Within Functional Limits for tasks assessed      Posture/Postural Control   Posture/Postural Control Postural limitations    Postural Limitations Forward head      ROM / Strength   AROM / PROM / Strength Strength;PROM;AROM      AROM   Overall AROM Comments lumbar flexion WNL      PROM   Overall PROM Comments hip rotation 20% limited Rt LE; 10% limited Lt LE      Strength   Overall Strength Comments hip abduction 4/5 bil      Flexibility   Soft Tissue Assessment /Muscle Length yes    Hamstrings 10% limited bil      Palpation  Palpation comment tension in gluteals bil      Ambulation/Gait   Gait Pattern Within Functional Limits                      Objective measurements completed on examination: See above findings.     Pelvic Floor Special Questions - 03/24/20 0001    Prior Pregnancies Yes    Number of Pregnancies 2    Number of Vaginal Deliveries 2    Currently Sexually Active No    Urinary Leakage Yes    How often a lot daily - small dribbles    Pad use y    Activities that cause leaking Walking    Urinary urgency No    Urinary frequency normal    Fecal incontinence No   sometimes difficulty   Falling out feeling (prolapse) No    Skin Integrity Intact    Prolapse Anterior Wall   mild   Pelvic Floor Internal Exam pt identity confirmed and informed consent given to perform internal soft tissue assessment    Exam Type Vaginal    Palpation slight bladder prolapse with bulging in supine and can palpate  internally; transverse peroneus and levators tight and mild TTP    Strength fair squeeze, definite lift    Strength # of reps 3    Strength # of seconds 3    Tone high tone of levators and transverse peroneus            [facility] Adult PT Treatment/Exercise - 03/24/20 0001      Self-Care   Self-Care Other Self-Care Comments    Other Self-Care Comments  toileting; breathing; intial HEP                  PT Education - 03/24/20 0851    Education Details Access Code: [email or web address] and toileting    Person(s) Educated Patient    Methods Explanation;Demonstration;Verbal cues;Handout    Comprehension Verbalized understanding;Returned demonstration            PT Short Term Goals - 03/24/20 0952      PT SHORT TERM GOAL #1   Title ind with intital HEP    Time 4    Period Weeks    Status New    Target Date 04/21/20      PT SHORT TERM GOAL #2   Title able to sustain pelvic floor MMT 2/5 for at least 10 sec x 3.    Time 6    Period Weeks    Status New    Target Date 05/05/20             PT Long Term Goals - 03/24/20 0948      PT LONG TERM GOAL #1   Title Pt will be able to walk 1 mile without leakage    Time 12    Period Weeks    Status New    Target Date 06/16/20      PT LONG TERM GOAL #2   Title Pt will be able to do activities in the house without leakage and not have to change clothes during the day    Time 12    Period Weeks    Status New    Target Date 06/16/20      PT LONG TERM GOAL #3   Title Pt will be ind with advanced HEP    Time 12    Period Weeks    Status New    Target Date  06/16/20      PT LONG TERM GOAL #4   Title Pt will be able to increase walking speed for a fast walk for at least 5 minute intervals without leakage    Time 12    Period Weeks    Status New    Target Date 06/16/20      PT LONG TERM GOAL #5   Title Pt will be confident to go out in the community in regular underwear    Time 12    Period Weeks    Status New    Target  Date 06/16/20                  Plan - 03/24/20 0842    Clinical Impression Statement Pt is 69 y/o female with urinary leakage that has been ongoing for at least 5 years.  This issue is limiting her day due to needing to change clothes mid-day and unable to walk for as long of distance and walking speed is limited.  Pt demonstrates some hip weakness and decreased ROM as mentioned above.  Pt has pelvic floor weakness as well as tension mentioned above. Pt will benefit from skilled PT to address above mentioned impairment for improved quality of life    Personal Factors and Comorbidities Comorbidity 2;Time since onset of injury/illness/exacerbation    Comorbidities hx of tubal ligation; vaginal deliveries/pregnancies x 2    Examination-Activity Limitations Continence    Examination-Participation Restrictions Community Activity    Stability/Clinical Decision Making Evolving/Moderate complexity    Clinical Decision Making Low    Rehab Potential Excellent    PT Frequency 1x / week    PT Duration 12 weeks    PT Treatment/Interventions ADLs/Self Care Home Management;Biofeedback;Cryotherapy;Electrical Stimulation;Moist Heat;Traction;Neuromuscular re-education;Therapeutic exercise;Therapeutic activities;Gait training;Patient/family education;Manual techniques;Dry needling;Passive range of motion;Taping    PT Next Visit Plan stretch and relax pelvic floor maybe biofeedback; lumbar and hip ROM and stretch all directions; intial strength clamshells and TrA activation    PT Home Exercise Plan Access Code: [email or web address]    Consulted and Agree with Plan of Care Patient           Patient will benefit from skilled therapeutic intervention in order to improve the following deficits and impairments:  Decreased strength, Decreased coordination, Decreased range of motion, Increased fascial restricitons, Increased muscle spasms  Visit Diagnosis: Cramp and spasm  Muscle weakness (generalized)  Unspecified  lack of coordination     Problem List There are no problems to display for this patient.   Camillo Flaming Rishita Petron, PT 03/24/2020, 10:01 AM  [facility] [address] [place], [place], [address] Phone: [phone]   Fax:  [phone]  Name: SHANTERICA BIEHLER MRN: [id number] Date of Birth: May 26, 1951

## 2020-03-31 ENCOUNTER — Other Ambulatory Visit: Payer: Self-pay

## 2020-03-31 ENCOUNTER — Ambulatory Visit: Payer: Medicare HMO | Attending: Family Medicine | Admitting: Physical Therapy

## 2020-03-31 DIAGNOSIS — M6281 Muscle weakness (generalized): Secondary | ICD-10-CM | POA: Diagnosis not present

## 2020-03-31 DIAGNOSIS — R279 Unspecified lack of coordination: Secondary | ICD-10-CM | POA: Insufficient documentation

## 2020-03-31 DIAGNOSIS — R252 Cramp and spasm: Secondary | ICD-10-CM | POA: Insufficient documentation

## 2020-03-31 NOTE — Therapy (Signed)
[facility] [address] [place], [place], [address] Phone: [phone]   Fax:  [phone]  Physical Therapy Treatment  Patient Details  Name: KRYSTIANNA SOTH MRN: [id number] Date of Birth: 01-11-1951 Referring Provider (PT): Kathyrn Lass, MD   Encounter Date: 03/31/2020   PT End of Session - 03/31/20 1019    Visit Number 2    Date for PT Re-Evaluation 06/16/20    PT Start Time 1017    PT Stop Time 1100    PT Time Calculation (min) 43 min    Activity Tolerance Patient tolerated treatment well    Behavior During Therapy [facility] for tasks assessed/performed           Past Medical History:  Diagnosis Date  . Complication of anesthesia    slow to awaken after Colonoscopy, "wipe out for 1 day and half after Tubal Ligation"  . Osteopenia 11/2015   T score -1.9 FRAX 8%/1%    Past Surgical History:  Procedure Laterality Date  . COLONOSCOPY    . COLONOSCOPY W/ POLYPECTOMY    . ORIF RADIAL FRACTURE Right 11/26/2016   Procedure: Right elbow open reduction and internal fixation and repair as indicated;  Surgeon: Iran Planas, MD;  Location: [place];  Service: Orthopedics;  Laterality: Right;  . TUBAL LIGATION    . WISDOM TOOTH EXTRACTION      There were no vitals filed for this visit.   Subjective Assessment - 03/31/20 1101    Subjective Pt reports the squatty potty helped a lot.  States she has been doing the stretches and not had the leakage around the house since last week.    Patient Stated Goals be able to walk without having leakage and not have to use depends/absorbant underwear    Currently in Pain? No/denies                             [facility] Adult PT Treatment/Exercise - 03/31/20 0001      Neuro Re-ed    Neuro Re-ed Details  all exercises done with breathing into back and pelvic floor; TrA with TC and verbal cues      Lumbar Exercises: Stretches   Double Knee to Chest Stretch Limitations  happy baby    Figure 4 Stretch 2 reps;20 seconds      Lumbar Exercises: Supine   AB Set Limitations tighten and hold 3 sec - TrA 10x    Clam 10 reps;3 seconds    Clam Limitations blue loop    Bent Knee Raise 10 reps    Other Supine Lumbar Exercises bent knee fallout -10x      Lumbar Exercises: Quadruped   [place]/Old Horse 10 reps    Other Quadruped Lumbar Exercises child's pse      Manual Therapy   Manual Therapy Myofascial release    Myofascial Release thoracolumbar fascia with trigger point release along paraspinals                    PT Short Term Goals - 03/31/20 1108      PT SHORT TERM GOAL #1   Title ind with intital HEP    Status Achieved      PT SHORT TERM GOAL #2   Title able to sustain pelvic floor MMT 2/5 for at least 10 sec x 3.    Status On-going  PT Long Term Goals - 03/24/20 0948      PT LONG TERM GOAL #1   Title Pt will be able to walk 1 mile without leakage    Time 12    Period Weeks    Status New    Target Date 06/16/20      PT LONG TERM GOAL #2   Title Pt will be able to do activities in the house without leakage and not have to change clothes during the day    Time 12    Period Weeks    Status New    Target Date 06/16/20      PT LONG TERM GOAL #3   Title Pt will be ind with advanced HEP    Time 12    Period Weeks    Status New    Target Date 06/16/20      PT LONG TERM GOAL #4   Title Pt will be able to increase walking speed for a fast walk for at least 5 minute intervals without leakage    Time 12    Period Weeks    Status New    Target Date 06/16/20      PT LONG TERM GOAL #5   Title Pt will be confident to go out in the community in regular underwear    Time 12    Period Weeks    Status New    Target Date 06/16/20                 Plan - 03/31/20 1104    Clinical Impression Statement Pt is doing well with intial core strength.  She has been doing well making progress towards her goals.  Pt had  tension in the thoracolumbar fascia which released well with MFR techniques.  Pt will benefit from skilled PT to porgress core and pelvic floor strength for maximum return to activities.    PT Treatment/Interventions ADLs/Self Care Home Management;Biofeedback;Cryotherapy;Electrical Stimulation;Moist Heat;Traction;Neuromuscular re-education;Therapeutic exercise;Therapeutic activities;Gait training;Patient/family education;Manual techniques;Dry needling;Passive range of motion;Taping    PT Next Visit Plan f/u on new exercises; progress core and pelvic floor strength and endurance; MFR to thoracolumbar if needed; clams in sidelying TrA activation    PT Home Exercise Plan Access Code: [email or web address]    Consulted and Agree with Plan of Care Patient           Patient will benefit from skilled therapeutic intervention in order to improve the following deficits and impairments:  Decreased strength, Decreased coordination, Decreased range of motion, Increased fascial restricitons, Increased muscle spasms  Visit Diagnosis: Cramp and spasm  Muscle weakness (generalized)  Unspecified lack of coordination     Problem List There are no problems to display for this patient.   Camillo Flaming Naavya Postma, PT 03/31/2020, 11:19 AM  [facility] [address], [place], [place], [address] Phone: [phone]   Fax:  [phone]  Name: CHERILYNN SCHOMBURG MRN: [id number] Date of Birth: 1951/08/17

## 2020-03-31 NOTE — Patient Instructions (Signed)
Bladder Irritants  Certain foods and beverages can be irritating to the bladder.  Avoiding these irritants may decrease your symptoms of urinary urgency, frequency or bladder pain.  Even reducing your intake can help with your symptoms.  Not everyone is sensitive to all bladder irritants, so you may consider focusing on one irritant at a time, removing or reducing your intake of that irritant for 7-10 days to see if this change helps your symptoms.  Water intake is also very important.  Below is a list of bladder irritants.  Drinks: alcohol, carbonated beverages, caffeinated beverages such as coffee and tea, drinks with artificial sweeteners, citrus juices, apple juice, tomato juice  Foods: tomatoes and tomato based foods, spicy food, sugar and artificial sweeteners, vinegar, chocolate, raw onion, apples, citrus fruits, pineapple, cranberries, tomatoes, strawberries, plums, peaches, cantaloupe  Other: acidic urine (too concentrated) - see water intake info below  Substitutes you can try that are NOT irritating to the bladder: cooked onion, pears, papayas, sun-brewed decaf teas, watermelons, non-citrus herbal teas, apricots, kava and low-acid instant drinks (Postum).    WATER INTAKE: Remember to drink lots of water (aim for fluid intake of half your body weight with 2/3 of fluids being water).  You may be limiting fluids due to fear of leakage, but this can actually worsen urgency symptoms due to highly concentrated urine.  Water helps balance the pH of your urine so it doesn't become too acidic - acidic urine is a bladder irritant!   [facility] [address], [address] [place], [place] [address] Phone # [phone] Fax [phone]  

## 2020-04-07 ENCOUNTER — Encounter: Payer: Self-pay | Admitting: Physical Therapy

## 2020-04-07 ENCOUNTER — Ambulatory Visit: Payer: Medicare HMO | Admitting: Physical Therapy

## 2020-04-07 ENCOUNTER — Other Ambulatory Visit: Payer: Self-pay

## 2020-04-07 DIAGNOSIS — M6281 Muscle weakness (generalized): Secondary | ICD-10-CM

## 2020-04-07 DIAGNOSIS — R252 Cramp and spasm: Secondary | ICD-10-CM | POA: Diagnosis not present

## 2020-04-07 DIAGNOSIS — R279 Unspecified lack of coordination: Secondary | ICD-10-CM

## 2020-04-07 NOTE — Therapy (Signed)
[facility] [address] [place], [place], [address] Phone: [phone]   Fax:  [phone]  Physical Therapy Treatment  Patient Details  Name: Mandy Willis MRN: [id number] Date of Birth: [birth year] Referring Provider (PT): Kathyrn Lass, MD   Encounter Date: 04/07/2020   PT End of Session - 04/07/20 1013    Visit Number 3    Date for PT Re-Evaluation 06/16/20    PT Start Time 0931    PT Stop Time 1010    PT Time Calculation (min) 39 min    Activity Tolerance Patient tolerated treatment well    Behavior During Therapy [facility] for tasks assessed/performed           Past Medical History:  Diagnosis Date   Complication of anesthesia    slow to awaken after Colonoscopy, "wipe out for 1 day and half after Tubal Ligation"   Osteopenia 11/2015   T score -1.9 FRAX 8%/1%    Past Surgical History:  Procedure Laterality Date   COLONOSCOPY     COLONOSCOPY W/ POLYPECTOMY     ORIF RADIAL FRACTURE Right 11/26/2016   Procedure: Right elbow open reduction and internal fixation and repair as indicated;  Surgeon: Iran Planas, MD;  Location: [place];  Service: Orthopedics;  Laterality: Right;   TUBAL LIGATION     WISDOM TOOTH EXTRACTION      There were no vitals filed for this visit.   Subjective Assessment - 04/07/20 0936    Subjective Pt states still feeling better around the house.  Pt states she had to go when in the car                             [facility] Adult PT Treatment/Exercise - 04/07/20 0001      Neuro Re-ed    Neuro Re-ed Details  all exercises cues for pelvic alignment and TrA and pelvic floor contraction      Lumbar Exercises: Stretches   Active Hamstring Stretch Right;Left;1 rep;30 seconds      Lumbar Exercises: Aerobic   Elliptical L1 x 3 min; i4 - cues to ac      Lumbar Exercises: Standing   Functional Squats 10 reps    Other Standing Lumbar Exercises hip abd - green loop; hip  ext 2 x10    Other Standing Lumbar Exercises step up 6" with pelvic contraction - 10x each side      Lumbar Exercises: Supine   Clam Limitations blue loop    Other Supine Lumbar Exercises bent knee fallout -10x                    PT Short Term Goals - 03/31/20 1108      PT SHORT TERM GOAL #1   Title ind with intital HEP    Status Achieved      PT SHORT TERM GOAL #2   Title able to sustain pelvic floor MMT 2/5 for at least 10 sec x 3.    Status On-going             PT Long Term Goals - 03/24/20 0948      PT LONG TERM GOAL #1   Title Pt will be able to walk 1 mile without leakage    Time 12    Period Weeks    Status New    Target Date 06/16/20      PT LONG TERM GOAL #  2   Title Pt will be able to do activities in the house without leakage and not have to change clothes during the day    Time 12    Period Weeks    Status New    Target Date 06/16/20      PT LONG TERM GOAL #3   Title Pt will be ind with advanced HEP    Time 12    Period Weeks    Status New    Target Date 06/16/20      PT LONG TERM GOAL #4   Title Pt will be able to increase walking speed for a fast walk for at least 5 minute intervals without leakage    Time 12    Period Weeks    Status New    Target Date 06/16/20      PT LONG TERM GOAL #5   Title Pt will be confident to go out in the community in regular underwear    Time 12    Period Weeks    Status New    Target Date 06/16/20                 Plan - 04/07/20 1011    Clinical Impression Statement Pt did well with progressing to standing exercises.  Overall, she is much better around the house and gettingto bathroom with urge techniques.  Now working on strength for faster walking.  pt needed extra time and cues to breathe due to high level of awareness still needed to correctly activate the pelvic floor.    PT Treatment/Interventions ADLs/Self Care Home Management;Biofeedback;Cryotherapy;Electrical Stimulation;Moist  Heat;Traction;Neuromuscular re-education;Therapeutic exercise;Therapeutic activities;Gait training;Patient/family education;Manual techniques;Dry needling;Passive range of motion;Taping    PT Next Visit Plan f/u on new exercises; progress core and pelvic floor strength and endurance; side lying and qped ex    PT Home Exercise Plan Access Code: [email or web address]    Consulted and Agree with Plan of Care Patient           Patient will benefit from skilled therapeutic intervention in order to improve the following deficits and impairments:  Decreased strength, Decreased coordination, Decreased range of motion, Increased fascial restricitons, Increased muscle spasms  Visit Diagnosis: Cramp and spasm  Muscle weakness (generalized)  Unspecified lack of coordination     Problem List There are no problems to display for this patient.   Camillo Flaming Danikah Budzik, PT 04/07/2020, 10:14 AM  [facility] [address], [place], [place], [address] Phone: [phone]   Fax:  [phone]  Name: Mandy Willis MRN: [id number] Date of Birth: 31-Dec-1950

## 2020-04-14 ENCOUNTER — Encounter: Payer: Self-pay | Admitting: Physical Therapy

## 2020-04-14 ENCOUNTER — Ambulatory Visit: Payer: Medicare HMO | Admitting: Physical Therapy

## 2020-04-14 ENCOUNTER — Other Ambulatory Visit: Payer: Self-pay

## 2020-04-14 DIAGNOSIS — R252 Cramp and spasm: Secondary | ICD-10-CM | POA: Diagnosis not present

## 2020-04-14 DIAGNOSIS — R279 Unspecified lack of coordination: Secondary | ICD-10-CM | POA: Diagnosis not present

## 2020-04-14 DIAGNOSIS — M6281 Muscle weakness (generalized): Secondary | ICD-10-CM

## 2020-04-14 NOTE — Therapy (Signed)
[facility] [address] [place], [place], [address] Phone: [phone]   Fax:  [phone]  Physical Therapy Treatment  Patient Details  Name: Mandy Willis MRN: [id number] Date of Birth: 02/22/1951 Referring Provider (PT): Kathyrn Lass, MD   Encounter Date: 04/14/2020   PT End of Session - 04/14/20 0939    Visit Number 4    Date for PT Re-Evaluation 06/16/20    PT Start Time 0932    PT Stop Time 1012    PT Time Calculation (min) 40 min    Activity Tolerance Patient tolerated treatment well    Behavior During Therapy [facility] for tasks assessed/performed           Past Medical History:  Diagnosis Date  . Complication of anesthesia    slow to awaken after Colonoscopy, "wipe out for 1 day and half after Tubal Ligation"  . Osteopenia 11/2015   T score -1.9 FRAX 8%/1%    Past Surgical History:  Procedure Laterality Date  . COLONOSCOPY    . COLONOSCOPY W/ POLYPECTOMY    . ORIF RADIAL FRACTURE Right 11/26/2016   Procedure: Right elbow open reduction and internal fixation and repair as indicated;  Surgeon: Iran Planas, MD;  Location: [place];  Service: Orthopedics;  Laterality: Right;  . TUBAL LIGATION    . WISDOM TOOTH EXTRACTION      There were no vitals filed for this visit.   Subjective Assessment - 04/14/20 0936    Subjective Pt states she walked  1/2 mile and it was just dribbling out and it was a mess last Wednesday.  Pt was walking faster with her friend.    Patient Stated Goals be able to walk without having leakage and not have to use depends/absorbant underwear    Currently in Pain? No/denies                             [facility] Adult PT Treatment/Exercise - 04/14/20 0001      Lumbar Exercises: Stretches   Double Knee to Chest Stretch 1 rep;30 seconds    Figure 4 Stretch 2 reps;20 seconds      Lumbar Exercises: Supine   AB Set Limitations tighten and hold 3 sec - TrA and pelvic  floor 10x    Clam Limitations yellow loop - 10x    Bridge 10 reps;3 seconds    Other Supine Lumbar Exercises bent knee fallout -10x      Lumbar Exercises: Quadruped   Other Quadruped Lumbar Exercises rocking    Other Quadruped Lumbar Exercises mod qped arm reach and  LE ext and abduction                    PT Short Term Goals - 03/31/20 1108      PT SHORT TERM GOAL #1   Title ind with intital HEP    Status Achieved      PT SHORT TERM GOAL #2   Title able to sustain pelvic floor MMT 2/5 for at least 10 sec x 3.    Status On-going             PT Long Term Goals - 03/24/20 0948      PT LONG TERM GOAL #1   Title Pt will be able to walk 1 mile without leakage    Time 12    Period Weeks    Status New  Target Date 06/16/20      PT LONG TERM GOAL #2   Title Pt will be able to do activities in the house without leakage and not have to change clothes during the day    Time 12    Period Weeks    Status New    Target Date 06/16/20      PT LONG TERM GOAL #3   Title Pt will be ind with advanced HEP    Time 12    Period Weeks    Status New    Target Date 06/16/20      PT LONG TERM GOAL #4   Title Pt will be able to increase walking speed for a fast walk for at least 5 minute intervals without leakage    Time 12    Period Weeks    Status New    Target Date 06/16/20      PT LONG TERM GOAL #5   Title Pt will be confident to go out in the community in regular underwear    Time 12    Period Weeks    Status New    Target Date 06/16/20                 Plan - 04/14/20 1018    Clinical Impression Statement Today's session focused on isolation of the pelvic floor muscles.  Pt needed to do more modified positions.  She was able to do in qped and modified leaning on the table, but did have some wrist pain so modification were made.  Pt was able to perform the new exercises correctly needing TC to ensure she is not bulging.  Pt was also given info on adding  intervals to normal walking to see if she can increase the walking speed.    PT Treatment/Interventions ADLs/Self Care Home Management;Biofeedback;Cryotherapy;Electrical Stimulation;Moist Heat;Traction;Neuromuscular re-education;Therapeutic exercise;Therapeutic activities;Gait training;Patient/family education;Manual techniques;Dry needling;Passive range of motion;Taping    PT Next Visit Plan f/u on new exercises; progress core and pelvic floor strength and endurance; side lying and qped ex    PT Home Exercise Plan Access Code: [email or web address]    Consulted and Agree with Plan of Care Patient           Patient will benefit from skilled therapeutic intervention in order to improve the following deficits and impairments:  Decreased strength, Decreased coordination, Decreased range of motion, Increased fascial restricitons, Increased muscle spasms  Visit Diagnosis: Cramp and spasm  Muscle weakness (generalized)  Unspecified lack of coordination     Problem List There are no problems to display for this patient.   Camillo Flaming Fotini Lemus, PT 04/14/2020, 10:45 AM  [facility] [address] [place], [place], [address] Phone: [phone]   Fax:  [phone]  Name: MAUREENA DABBS MRN: [id number] Date of Birth: [birth year]

## 2020-04-16 DIAGNOSIS — Z131 Encounter for screening for diabetes mellitus: Secondary | ICD-10-CM | POA: Diagnosis not present

## 2020-04-16 DIAGNOSIS — Z0000 Encounter for general adult medical examination without abnormal findings: Secondary | ICD-10-CM | POA: Diagnosis not present

## 2020-04-16 DIAGNOSIS — E7800 Pure hypercholesterolemia, unspecified: Secondary | ICD-10-CM | POA: Diagnosis not present

## 2020-04-16 DIAGNOSIS — Z1211 Encounter for screening for malignant neoplasm of colon: Secondary | ICD-10-CM | POA: Diagnosis not present

## 2020-04-16 DIAGNOSIS — Z1159 Encounter for screening for other viral diseases: Secondary | ICD-10-CM | POA: Diagnosis not present

## 2020-04-16 DIAGNOSIS — E663 Overweight: Secondary | ICD-10-CM | POA: Diagnosis not present

## 2020-04-16 DIAGNOSIS — Z6829 Body mass index (BMI) 29.0-29.9, adult: Secondary | ICD-10-CM | POA: Diagnosis not present

## 2020-04-24 ENCOUNTER — Ambulatory Visit: Payer: Medicare HMO | Admitting: Physical Therapy

## 2020-04-24 ENCOUNTER — Other Ambulatory Visit: Payer: Self-pay

## 2020-04-24 ENCOUNTER — Encounter: Payer: Self-pay | Admitting: Physical Therapy

## 2020-04-24 DIAGNOSIS — M6281 Muscle weakness (generalized): Secondary | ICD-10-CM

## 2020-04-24 DIAGNOSIS — R252 Cramp and spasm: Secondary | ICD-10-CM

## 2020-04-24 DIAGNOSIS — R279 Unspecified lack of coordination: Secondary | ICD-10-CM | POA: Diagnosis not present

## 2020-04-24 NOTE — Therapy (Signed)
[facility] [address] [place], [place], [address] Phone: [phone]   Fax:  [phone]  Physical Therapy Treatment  Patient Details  Name: Mandy Willis MRN: [id number] Date of Birth: 06/01/1951 Referring Provider (PT): Kathyrn Lass, MD   Encounter Date: 04/24/2020   PT End of Session - 04/24/20 0851    Visit Number 5    Date for PT Re-Evaluation 06/16/20    PT Start Time 0846    PT Stop Time 0926    PT Time Calculation (min) 40 min    Activity Tolerance Patient tolerated treatment well    Behavior During Therapy [facility] for tasks assessed/performed           Past Medical History:  Diagnosis Date   Complication of anesthesia    slow to awaken after Colonoscopy, "wipe out for 1 day and half after Tubal Ligation"   Osteopenia 11/2015   T score -1.9 FRAX 8%/1%    Past Surgical History:  Procedure Laterality Date   COLONOSCOPY     COLONOSCOPY W/ POLYPECTOMY     ORIF RADIAL FRACTURE Right 11/26/2016   Procedure: Right elbow open reduction and internal fixation and repair as indicated;  Surgeon: Iran Planas, MD;  Location: [place];  Service: Orthopedics;  Laterality: Right;   TUBAL LIGATION     WISDOM TOOTH EXTRACTION      There were no vitals filed for this visit.   Subjective Assessment - 04/24/20 0848    Subjective I was able to walk slowly for 30 minutes and walk to go out this week and no dribbles.  She has broken up the exercises more but did not do the intervals    Patient Stated Goals be able to walk without having leakage and not have to use depends/absorbant underwear    Currently in Pain? No/denies                             [facility] Adult PT Treatment/Exercise - 04/24/20 0001      Lumbar Exercises: Stretches   Active Hamstring Stretch Right;Left;1 rep;30 seconds    Hip Flexor Stretch Right;Left;1 rep;30 seconds    Figure 4 Stretch 2 reps;20 seconds      Lumbar  Exercises: Machines for Strengthening   Leg Press seat 7 bilat 85#x20; single leg 40# x 20      Lumbar Exercises: Standing   Shoulder Adduction Limitations standing hip ext and abduction light green    Other Standing Lumbar Exercises standing band pulls SLS 5 pulls x 5    Other Standing Lumbar Exercises isometric shoulder flexion      Lumbar Exercises: Sidelying   Clam Right;Left;20 reps    Hip Abduction Right;Left;10 reps      Lumbar Exercises: Quadruped   Straight Leg Raise 5 reps    Straight Leg Raises Limitations limited by wrist strength                    PT Short Term Goals - 03/31/20 1108      PT SHORT TERM GOAL #1   Title ind with intital HEP    Status Achieved      PT SHORT TERM GOAL #2   Title able to sustain pelvic floor MMT 2/5 for at least 10 sec x 3.    Status On-going             PT Long Term Goals -  04/24/20 0856      PT LONG TERM GOAL #1   Title Pt will be able to walk 1 mile without leakage    Baseline 1.5 miles without leakage    Status Achieved      PT LONG TERM GOAL #2   Title Pt will be able to do activities in the house without leakage and not have to change clothes during the day    Baseline wearing depends to prevent that    Status On-going      PT LONG TERM GOAL #3   Title Pt will be ind with advanced HEP    Status On-going      PT LONG TERM GOAL #4   Title Pt will be able to increase walking speed for a fast walk for at least 5 minute intervals without leakage    Baseline still walking slowly    Status On-going      PT LONG TERM GOAL #5   Title Pt will be confident to go out in the community in regular underwear    Baseline still not wearing because will get a dribble                 Plan - 04/24/20 0926    Clinical Impression Statement Pt is making steady progress with HEP and able to walk more without leakage.  Pt was able to progress strength.  She needs some cues to keep pelvis neutral throughout exercises.  No pain during treatment today.  Pt will benefit from skilled PT to continue to progress strength    PT Treatment/Interventions ADLs/Self Care Home Management;Biofeedback;Cryotherapy;Electrical Stimulation;Moist Heat;Traction;Neuromuscular re-education;Therapeutic exercise;Therapeutic activities;Gait training;Patient/family education;Manual techniques;Dry needling;Passive range of motion;Taping    PT Next Visit Plan f/u on new walking speed and regular underwear trial?; progress core and pelvic floor strength as tolerated    PT Home Exercise Plan Access Code: [email or web address]    Consulted and Agree with Plan of Care Patient           Patient will benefit from skilled therapeutic intervention in order to improve the following deficits and impairments:  Decreased strength, Decreased coordination, Decreased range of motion, Increased fascial restricitons, Increased muscle spasms  Visit Diagnosis: No diagnosis found.     Problem List There are no problems to display for this patient.   Camillo Flaming Janan Bogie, PT 04/24/2020, 9:28 AM  [facility] [address] [place], [place], [address] Phone: [phone]   Fax:  [phone]  Name: Mandy Willis MRN: [id number] Date of Birth: [birth year]

## 2020-04-28 DIAGNOSIS — R69 Illness, unspecified: Secondary | ICD-10-CM | POA: Diagnosis not present

## 2020-04-29 ENCOUNTER — Encounter: Payer: Self-pay | Admitting: Physical Therapy

## 2020-04-29 ENCOUNTER — Ambulatory Visit: Payer: Medicare HMO | Admitting: Physical Therapy

## 2020-04-29 ENCOUNTER — Other Ambulatory Visit: Payer: Self-pay

## 2020-04-29 DIAGNOSIS — M6281 Muscle weakness (generalized): Secondary | ICD-10-CM | POA: Diagnosis not present

## 2020-04-29 DIAGNOSIS — R252 Cramp and spasm: Secondary | ICD-10-CM | POA: Diagnosis not present

## 2020-04-29 DIAGNOSIS — R279 Unspecified lack of coordination: Secondary | ICD-10-CM | POA: Diagnosis not present

## 2020-04-29 NOTE — Therapy (Addendum)
[facility] [address], [place], [place], [address] Phone: [phone]   Fax:  [phone]  Physical Therapy Treatment  Patient Details  Name: ANICE WILSHIRE MRN: [id number] Date of Birth: [birth year] Referring Provider (PT): Kathyrn Lass, MD   Encounter Date: 04/29/2020   PT End of Session - 04/29/20 1131    Visit Number 6    Date for PT Re-Evaluation 06/16/20    PT Start Time 1102    PT Stop Time 1142    PT Time Calculation (min) 40 min    Activity Tolerance Patient tolerated treatment well    Behavior During Therapy [facility] for tasks assessed/performed           Past Medical History:  Diagnosis Date  . Complication of anesthesia    slow to awaken after Colonoscopy, "wipe out for 1 day and half after Tubal Ligation"  . Osteopenia 11/2015   T score -1.9 FRAX 8%/1%    Past Surgical History:  Procedure Laterality Date  . COLONOSCOPY    . COLONOSCOPY W/ POLYPECTOMY    . ORIF RADIAL FRACTURE Right 11/26/2016   Procedure: Right elbow open reduction and internal fixation and repair as indicated;  Surgeon: Iran Planas, MD;  Location: [place];  Service: Orthopedics;  Laterality: Right;  . TUBAL LIGATION    . WISDOM TOOTH EXTRACTION      There were no vitals filed for this visit.   Subjective Assessment - 04/29/20 1134    Subjective I tried walking 4 blocks after I had gone to the bathroom, I had leakage when trying to do the kegels    Patient Stated Goals be able to walk without having leakage and not have to use depends/absorbant underwear    Currently in Pain? No/denies                             [facility] Adult PT Treatment/Exercise - 04/30/20 0001      Self-Care   Other Self-Care Comments  pelvic weights - described and educated how to use, show demo      Neuro Re-ed    Neuro Re-ed Details  biofeedback - reduced resting tone after exercises fatigue - standing kegel, single leg marching,  mini squats, exhale with exertion                    PT Short Term Goals - 03/31/20 1108      PT SHORT TERM GOAL #1   Title ind with intital HEP    Status Achieved      PT SHORT TERM GOAL #2   Title able to sustain pelvic floor MMT 2/5 for at least 10 sec x 3.    Status On-going             PT Long Term Goals - 04/24/20 0856      PT LONG TERM GOAL #1   Title Pt will be able to walk 1 mile without leakage    Baseline 1.5 miles without leakage    Status Achieved      PT LONG TERM GOAL #2   Title Pt will be able to do activities in the house without leakage and not have to change clothes during the day    Baseline wearing depends to prevent that    Status On-going      PT LONG TERM GOAL #3   Title Pt will be ind with  advanced HEP    Status On-going      PT LONG TERM GOAL #4   Title Pt will be able to increase walking speed for a fast walk for at least 5 minute intervals without leakage    Baseline still walking slowly    Status On-going      PT LONG TERM GOAL #5   Title Pt will be confident to go out in the community in regular underwear    Baseline still not wearing because will get a dribble                 Plan - 04/30/20 0939    Clinical Impression Statement Pt did well with biofeedback.  She demonstrates ability to engage pelvic floor in standing and single standing. She had a small leak with bending and squeezing demonstrating fatigue as well as possibly bearing down versus lifting.  Pt was educated in purchasing pelvic weights for better biofeedback when doing the exercises at home.  Pt continues to need skilled therapy for endurance and strength for full activities.    PT Treatment/Interventions ADLs/Self Care Home Management;Biofeedback;Cryotherapy;Electrical Stimulation;Moist Heat;Traction;Neuromuscular re-education;Therapeutic exercise;Therapeutic activities;Gait training;Patient/family education;Manual techniques;Dry needling;Passive range of  motion;Taping    PT Next Visit Plan porgress strength as tolerated core, hip, pelvic floor    PT Home Exercise Plan Access Code: [email or web address]    Consulted and Agree with Plan of Care Patient           Patient will benefit from skilled therapeutic intervention in order to improve the following deficits and impairments:  Decreased strength, Decreased coordination, Decreased range of motion, Increased fascial restricitons, Increased muscle spasms  Visit Diagnosis: Cramp and spasm  Muscle weakness (generalized)  Unspecified lack of coordination     Problem List There are no problems to display for this patient.   Camillo Flaming Ronell Duffus, PT 04/30/2020, 9:52 AM  [facility] [address], [place], [place], [address] Phone: [phone]   Fax:  [phone]  Name: IZZIE GEERS MRN: [id number] Date of Birth: 1951/04/29

## 2020-05-06 ENCOUNTER — Ambulatory Visit
Admission: RE | Admit: 2020-05-06 | Discharge: 2020-05-06 | Disposition: A | Discharge status: Home / Self Care | Payer: Medicare HMO | Source: Ambulatory Visit | Attending: Gynecology | Admitting: Gynecology

## 2020-05-06 ENCOUNTER — Other Ambulatory Visit: Payer: Self-pay

## 2020-05-06 ENCOUNTER — Ambulatory Visit
Admission: RE | Admit: 2020-05-06 | Discharge: 2020-05-06 | Disposition: A | Discharge status: Home / Self Care | Payer: Medicare HMO | Source: Ambulatory Visit | Attending: Family Medicine | Admitting: Family Medicine

## 2020-05-06 DIAGNOSIS — Z780 Asymptomatic menopausal state: Secondary | ICD-10-CM | POA: Diagnosis not present

## 2020-05-06 DIAGNOSIS — E2839 Other primary ovarian failure: Secondary | ICD-10-CM

## 2020-05-06 DIAGNOSIS — M8588 Other specified disorders of bone density and structure, other site: Secondary | ICD-10-CM | POA: Diagnosis not present

## 2020-05-06 DIAGNOSIS — Z1231 Encounter for screening mammogram for malignant neoplasm of breast: Secondary | ICD-10-CM | POA: Diagnosis not present

## 2020-05-07 ENCOUNTER — Other Ambulatory Visit: Payer: Self-pay | Admitting: Family Medicine

## 2020-05-07 DIAGNOSIS — R928 Other abnormal and inconclusive findings on diagnostic imaging of breast: Secondary | ICD-10-CM

## 2020-05-08 ENCOUNTER — Encounter: Payer: Self-pay | Admitting: Physical Therapy

## 2020-05-08 ENCOUNTER — Ambulatory Visit: Payer: Medicare HMO | Attending: Family Medicine | Admitting: Physical Therapy

## 2020-05-08 ENCOUNTER — Other Ambulatory Visit: Payer: Self-pay

## 2020-05-08 DIAGNOSIS — R252 Cramp and spasm: Secondary | ICD-10-CM | POA: Diagnosis not present

## 2020-05-08 DIAGNOSIS — M6281 Muscle weakness (generalized): Secondary | ICD-10-CM | POA: Diagnosis not present

## 2020-05-08 DIAGNOSIS — R279 Unspecified lack of coordination: Secondary | ICD-10-CM

## 2020-05-08 NOTE — Therapy (Signed)
[facility] [address] [place], [place], [address] Phone: [phone]   Fax:  [phone]  Physical Therapy Treatment  Patient Details  Name: Mandy Willis MRN: [id number] Date of Birth: 1950/11/04 Referring Provider (PT): Kathyrn Lass, MD   Encounter Date: 05/08/2020   PT End of Session - 05/08/20 1448    Visit Number 7    Date for PT Re-Evaluation 06/16/20    PT Start Time 1448    PT Stop Time 1528    PT Time Calculation (min) 40 min    Activity Tolerance Patient tolerated treatment well    Behavior During Therapy [facility] for tasks assessed/performed           Past Medical History:  Diagnosis Date   Complication of anesthesia    slow to awaken after Colonoscopy, "wipe out for 1 day and half after Tubal Ligation"   Osteopenia 11/2015   T score -1.9 FRAX 8%/1%    Past Surgical History:  Procedure Laterality Date   COLONOSCOPY     COLONOSCOPY W/ POLYPECTOMY     ORIF RADIAL FRACTURE Right 11/26/2016   Procedure: Right elbow open reduction and internal fixation and repair as indicated;  Surgeon: Iran Planas, MD;  Location: [place];  Service: Orthopedics;  Laterality: Right;   TUBAL LIGATION     WISDOM TOOTH EXTRACTION      There were no vitals filed for this visit.   Subjective Assessment - 05/08/20 1449    Subjective I feel the exercises more.  But I cannot hold it at all if I know I need to go, then I need to go.    Patient Stated Goals be able to walk without having leakage and not have to use depends/absorbant underwear    Currently in Pain? No/denies                             [facility] Adult PT Treatment/Exercise - 05/08/20 0001      Self-Care   Other Self-Care Comments  urge to void tecniques educated and handout      Neuro Re-ed    Neuro Re-ed Details  TC internal - pt identity confirmed and informed consent; bouncing on rebouder 3 ways with pelvic floor brace; sit to stand  blow as you go review (had dribble); TC externally                  PT Education - 05/08/20 1540    Education Details urge to void    Person(s) Educated Patient    Methods Explanation;Demonstration;Verbal cues;Handout    Comprehension Verbalized understanding;Returned demonstration            PT Short Term Goals - 03/31/20 1108      PT SHORT TERM GOAL #1   Title ind with intital HEP    Status Achieved      PT SHORT TERM GOAL #2   Title able to sustain pelvic floor MMT 2/5 for at least 10 sec x 3.    Status On-going             PT Long Term Goals - 05/08/20 1458      PT LONG TERM GOAL #1   Title Pt will be able to walk 1 mile without leakage    Baseline 1.5 miles without leakage    Status Achieved      PT LONG TERM GOAL #2   Title Pt  will be able to do activities in the house without leakage and not have to change clothes during the day    Baseline did one day but was not an active day    Status Partially Met      PT LONG TERM GOAL #3   Title Pt will be ind with advanced HEP    Status On-going      PT LONG TERM GOAL #4   Title Pt will be able to increase walking speed for a fast walk for at least 5 minute intervals without leakage    Baseline still walking slowly    Status On-going      PT LONG TERM GOAL #5   Title Pt will be confident to go out in the community in regular underwear    Status On-going                 Plan - 05/08/20 1512    Clinical Impression Statement Today's session focused on neuro re-ed with TC done internally with patient informed consent.  She did well with being able to feel she was engaging the muscle correctly.  She is still thinking a lot about the movements, but did well with additional resistance and challenge today.  Continue    PT Treatment/Interventions ADLs/Self Care Home Management;Biofeedback;Cryotherapy;Electrical Stimulation;Moist Heat;Traction;Neuromuscular re-education;Therapeutic exercise;Therapeutic  activities;Gait training;Patient/family education;Manual techniques;Dry needling;Passive range of motion;Taping    PT Next Visit Plan porgress strength as tolerated core, hip, pelvic floor    PT Home Exercise Plan Access Code: [email or web address]    Consulted and Agree with Plan of Care Patient           Patient will benefit from skilled therapeutic intervention in order to improve the following deficits and impairments:  Decreased strength, Decreased coordination, Decreased range of motion, Increased fascial restricitons, Increased muscle spasms  Visit Diagnosis: Cramp and spasm  Muscle weakness (generalized)  Unspecified lack of coordination     Problem List There are no problems to display for this patient.   Camillo Flaming Jaydian Santana, PT 05/08/2020, 3:42 PM  [facility] [address] [place], [place], [address] Phone: [phone]   Fax:  [phone]  Name: Mandy Willis MRN: [id number] Date of Birth: 02-Oct-1950

## 2020-05-08 NOTE — Patient Instructions (Signed)

## 2020-05-13 ENCOUNTER — Other Ambulatory Visit: Payer: Self-pay

## 2020-05-13 ENCOUNTER — Ambulatory Visit: Payer: Medicare HMO | Admitting: Physical Therapy

## 2020-05-13 ENCOUNTER — Encounter: Payer: Self-pay | Admitting: Physical Therapy

## 2020-05-13 DIAGNOSIS — R252 Cramp and spasm: Secondary | ICD-10-CM

## 2020-05-13 DIAGNOSIS — R279 Unspecified lack of coordination: Secondary | ICD-10-CM | POA: Diagnosis not present

## 2020-05-13 DIAGNOSIS — M6281 Muscle weakness (generalized): Secondary | ICD-10-CM | POA: Diagnosis not present

## 2020-05-13 NOTE — Therapy (Signed)
[facility] [address], [place], [place], [address] Phone: [phone]   Fax:  [phone]  Physical Therapy Treatment  Patient Details  Name: Mandy Willis MRN: [id number] Date of Birth: Oct 07, 1950 Referring Provider (PT): Kathyrn Lass, MD   Encounter Date: 05/13/2020   PT End of Session - 05/13/20 0927    Visit Number 8    Date for PT Re-Evaluation 06/16/20    PT Start Time 0928    PT Stop Time 1007    PT Time Calculation (min) 39 min    Activity Tolerance Patient tolerated treatment well    Behavior During Therapy [facility] for tasks assessed/performed           Past Medical History:  Diagnosis Date  . Complication of anesthesia    slow to awaken after Colonoscopy, "wipe out for 1 day and half after Tubal Ligation"  . Osteopenia 11/2015   T score -1.9 FRAX 8%/1%    Past Surgical History:  Procedure Laterality Date  . COLONOSCOPY    . COLONOSCOPY W/ POLYPECTOMY    . ORIF RADIAL FRACTURE Right 11/26/2016   Procedure: Right elbow open reduction and internal fixation and repair as indicated;  Surgeon: Iran Planas, MD;  Location: [place];  Service: Orthopedics;  Laterality: Right;  . TUBAL LIGATION    . WISDOM TOOTH EXTRACTION      There were no vitals filed for this visit.   Subjective Assessment - 05/13/20 1016    Subjective Exercises have been feeling great and I walked a lot without issues this weekend    Patient Stated Goals be able to walk without having leakage and not have to use depends/absorbant underwear    Currently in Pain? No/denies                             [facility] Adult PT Treatment/Exercise - 05/13/20 0001      Ambulation/Gait   Pre-Gait Activities stepping over with large steps and educated on using small quick step technique      Lumbar Exercises: Stretches   Active Hamstring Stretch Right;Left;1 rep;30 seconds    Gastroc Stretch Left;Right;1 rep;30 seconds       Lumbar Exercises: Aerobic   Nustep L4 x 5 min PT present for status update      Lumbar Exercises: Machines for Strengthening   Leg Press seat 7 bilat 90#x20      Lumbar Exercises: Standing   Shoulder Adduction Limitations standing hip abduction and slide back diagonal - green loop - 12x each side    Other Standing Lumbar Exercises half kneel chop red band; staggared stance press red band; stepping over plastic mat 2x10 each side    Other Standing Lumbar Exercises SLS on half foam both ways 2 x 10 sec      Lumbar Exercises: Supine   Other Supine Lumbar Exercises foam roll under lumbar, knee hugs with rocking; single knee hug with alt LE straight                    PT Short Term Goals - 03/31/20 1108      PT SHORT TERM GOAL #1   Title ind with intital HEP    Status Achieved      PT SHORT TERM GOAL #2   Title able to sustain pelvic floor MMT 2/5 for at least 10 sec x 3.    Status On-going  PT Long Term Goals - 05/08/20 1458      PT LONG TERM GOAL #1   Title Pt will be able to walk 1 mile without leakage    Baseline 1.5 miles without leakage    Status Achieved      PT LONG TERM GOAL #2   Title Pt will be able to do activities in the house without leakage and not have to change clothes during the day    Baseline did one day but was not an active day    Status Partially Met      PT LONG TERM GOAL #3   Title Pt will be ind with advanced HEP    Status On-going      PT LONG TERM GOAL #4   Title Pt will be able to increase walking speed for a fast walk for at least 5 minute intervals without leakage    Baseline still walking slowly    Status On-going      PT LONG TERM GOAL #5   Title Pt will be confident to go out in the community in regular underwear    Status On-going                 Plan - 05/13/20 0959    Clinical Impression Statement Pt did well with exercises today focused on single leg and walking related exercises with bracing when  taking a big step.  Pt reports no issues over the weekend and did a lot of walking.  Pt was educated in gait to take smaller steps when going faster and to practice bracing with large steps.  Pt will benefit from skilled PT to continue with POC    PT Treatment/Interventions ADLs/Self Care Home Management;Biofeedback;Cryotherapy;Electrical Stimulation;Moist Heat;Traction;Neuromuscular re-education;Therapeutic exercise;Therapeutic activities;Gait training;Patient/family education;Manual techniques;Dry needling;Passive range of motion;Taping    PT Next Visit Plan porgress strength as tolerated core, hip, pelvic floor    PT Home Exercise Plan Access Code: [email or web address]    Consulted and Agree with Plan of Care Patient           Patient will benefit from skilled therapeutic intervention in order to improve the following deficits and impairments:  Decreased strength, Decreased coordination, Decreased range of motion, Increased fascial restricitons, Increased muscle spasms  Visit Diagnosis: Cramp and spasm  Muscle weakness (generalized)  Unspecified lack of coordination     Problem List There are no problems to display for this patient.   Camillo Flaming Briseyda Fehr, PT 05/13/2020, 10:17 AM  [facility] [address] [place], [place], [address] Phone: [phone]   Fax:  [phone]  Name: Mandy Willis MRN: [id number] Date of Birth: 05/25/1951

## 2020-05-19 ENCOUNTER — Ambulatory Visit
Admission: RE | Admit: 2020-05-19 | Discharge: 2020-05-19 | Disposition: A | Discharge status: Home / Self Care | Payer: Medicare HMO | Source: Ambulatory Visit | Attending: Family Medicine | Admitting: Family Medicine

## 2020-05-19 ENCOUNTER — Other Ambulatory Visit: Payer: Self-pay

## 2020-05-19 DIAGNOSIS — R922 Inconclusive mammogram: Secondary | ICD-10-CM | POA: Diagnosis not present

## 2020-05-19 DIAGNOSIS — R928 Other abnormal and inconclusive findings on diagnostic imaging of breast: Secondary | ICD-10-CM

## 2020-05-19 DIAGNOSIS — N6320 Unspecified lump in the left breast, unspecified quadrant: Secondary | ICD-10-CM | POA: Diagnosis not present

## 2020-05-20 ENCOUNTER — Encounter: Payer: Self-pay | Admitting: Physical Therapy

## 2020-05-20 ENCOUNTER — Ambulatory Visit: Payer: Medicare HMO | Admitting: Physical Therapy

## 2020-05-20 DIAGNOSIS — M6281 Muscle weakness (generalized): Secondary | ICD-10-CM

## 2020-05-20 DIAGNOSIS — R279 Unspecified lack of coordination: Secondary | ICD-10-CM

## 2020-05-20 DIAGNOSIS — R252 Cramp and spasm: Secondary | ICD-10-CM | POA: Diagnosis not present

## 2020-05-20 NOTE — Therapy (Signed)
[facility] [address] [place], [place], [address] Phone: [phone]   Fax:  [phone]  Physical Therapy Treatment  Patient Details  Name: JIANA LEMAIRE MRN: [id number] Date of Birth: [birth year] Referring Provider (PT): Kathyrn Lass, MD   Encounter Date: 05/20/2020   PT End of Session - 05/20/20 0927    Visit Number 9    Date for PT Re-Evaluation 06/16/20    PT Start Time 0927    PT Stop Time 1008    PT Time Calculation (min) 41 min    Activity Tolerance Patient tolerated treatment well    Behavior During Therapy [facility] for tasks assessed/performed           Past Medical History:  Diagnosis Date  . Complication of anesthesia    slow to awaken after Colonoscopy, "wipe out for 1 day and half after Tubal Ligation"  . Osteopenia 11/2015   T score -1.9 FRAX 8%/1%    Past Surgical History:  Procedure Laterality Date  . COLONOSCOPY    . COLONOSCOPY W/ POLYPECTOMY    . ORIF RADIAL FRACTURE Right 11/26/2016   Procedure: Right elbow open reduction and internal fixation and repair as indicated;  Surgeon: Iran Planas, MD;  Location: [place];  Service: Orthopedics;  Laterality: Right;  . TUBAL LIGATION    . WISDOM TOOTH EXTRACTION      There were no vitals filed for this visit.   Subjective Assessment - 05/20/20 0940    Subjective I didn't walk as much this week.  Lifting my grandson who is 25lb was a little hard.    Patient Stated Goals be able to walk without having leakage and not have to use depends/absorbant underwear    Currently in Pain? No/denies                             [facility] Adult PT Treatment/Exercise - 05/20/20 0001      Lumbar Exercises: Aerobic   Nustep L1 x3; L 3 x 3 - PT present for status update      Lumbar Exercises: Standing   Lifting 10 reps    Lifting Weights (lbs) 10lb    Lifting Limitations hip hinge technique - 10x - hard time breathing during movement     Shoulder Adduction Limitations hip hinge in tall kneeling - 10x    Other Standing Lumbar Exercises step over foam roll with 2lb ankle - 15x each    Other Standing Lumbar Exercises walking with sport cord fwd/back 20lb; side 15lb [address];                   PT Education - 05/20/20 0946    Education Details bladder irritants    Person(s) Educated Patient    Methods Explanation    Comprehension Verbalized understanding            PT Short Term Goals - 03/31/20 1108      PT SHORT TERM GOAL #1   Title ind with intital HEP    Status Achieved      PT SHORT TERM GOAL #2   Title able to sustain pelvic floor MMT 2/5 for at least 10 sec x 3.    Status On-going             PT Long Term Goals - 05/08/20 1458      PT LONG TERM GOAL #1   Title Pt will be  able to walk 1 mile without leakage    Baseline 1.5 miles without leakage    Status Achieved      PT LONG TERM GOAL #2   Title Pt will be able to do activities in the house without leakage and not have to change clothes during the day    Baseline did one day but was not an active day    Status Partially Met      PT LONG TERM GOAL #3   Title Pt will be ind with advanced HEP    Status On-going      PT LONG TERM GOAL #4   Title Pt will be able to increase walking speed for a fast walk for at least 5 minute intervals without leakage    Baseline still walking slowly    Status On-going      PT LONG TERM GOAL #5   Title Pt will be confident to go out in the community in regular underwear    Status On-going                 Plan - 05/20/20 1012    Clinical Impression Statement Pt continues to progress exercises.  Needs extra time to work on coordination and for neuro re-ed with breathing and hip movement verses lumbar movement during hip hinging.  Pt will benefit from 1-2 more visits to ensure she is successful with HEP to continue working on exercises correctly.    PT Treatment/Interventions ADLs/Self Care Home  Management;Biofeedback;Cryotherapy;Electrical Stimulation;Moist Heat;Traction;Neuromuscular re-education;Therapeutic exercise;Therapeutic activities;Gait training;Patient/family education;Manual techniques;Dry needling;Passive range of motion;Taping    PT Next Visit Plan d/c visit likely; lifting porgression, hip hinge progression    PT Home Exercise Plan Access Code: [email or web address]    Consulted and Agree with Plan of Care Patient           Patient will benefit from skilled therapeutic intervention in order to improve the following deficits and impairments:  Decreased strength, Decreased coordination, Decreased range of motion, Increased fascial restricitons, Increased muscle spasms  Visit Diagnosis: Cramp and spasm  Muscle weakness (generalized)  Unspecified lack of coordination     Problem List There are no problems to display for this patient.   Camillo Flaming Anahid Eskelson, PT 05/20/2020, 10:18 AM  [facility] [address], [place], [place], [address] Phone: [phone]   Fax:  [phone]  Name: LONIE RUMMELL MRN: [id number] Date of Birth: 1951-01-11

## 2020-05-20 NOTE — Patient Instructions (Addendum)
Bladder Irritants  Certain foods and beverages can be irritating to the bladder.  Avoiding these irritants may decrease your symptoms of urinary urgency, frequency or bladder pain.  Even reducing your intake can help with your symptoms.  Not everyone is sensitive to all bladder irritants, so you may consider focusing on one irritant at a time, removing or reducing your intake of that irritant for 7-10 days to see if this change helps your symptoms.  Water intake is also very important.  Below is a list of bladder irritants.  Drinks: alcohol, carbonated beverages, caffeinated beverages such as coffee and tea, drinks with artificial sweeteners, citrus juices, apple juice, tomato juice  Foods: tomatoes and tomato based foods, spicy food, sugar and artificial sweeteners, vinegar, chocolate, raw onion, apples, citrus fruits, pineapple, cranberries, tomatoes, strawberries, plums, peaches, cantaloupe  Other: acidic urine (too concentrated) - see water intake info below  Substitutes you can try that are NOT irritating to the bladder: cooked onion, pears, papayas, sun-brewed decaf teas, watermelons, non-citrus herbal teas, apricots, kava and low-acid instant drinks (Postum).    WATER INTAKE: Remember to drink lots of water (aim for fluid intake of half your body weight with 2/3 of fluids being water).  You may be limiting fluids due to fear of leakage, but this can actually worsen urgency symptoms due to highly concentrated urine.  Water helps balance the pH of your urine so it doesn't become too acidic - acidic urine is a bladder irritant!   [place] [address], [place], [place] [address] Phone # [phone] Fax [phone]

## 2020-05-27 ENCOUNTER — Ambulatory Visit: Payer: Medicare HMO | Admitting: Physical Therapy

## 2020-05-27 ENCOUNTER — Encounter: Payer: Self-pay | Admitting: Physical Therapy

## 2020-05-27 ENCOUNTER — Other Ambulatory Visit: Payer: Self-pay

## 2020-05-27 DIAGNOSIS — R279 Unspecified lack of coordination: Secondary | ICD-10-CM | POA: Diagnosis not present

## 2020-05-27 DIAGNOSIS — R252 Cramp and spasm: Secondary | ICD-10-CM

## 2020-05-27 DIAGNOSIS — M6281 Muscle weakness (generalized): Secondary | ICD-10-CM

## 2020-05-27 NOTE — Therapy (Addendum)
[facility] [address] [place], [place], [address] Phone: [phone]   Fax:  [phone]  Physical Therapy Treatment Progress Note Reporting Period 03/24/20 to 05/27/20   See note below for Objective Data and Assessment of Progress/Goals.      Patient Details  Name: Mandy Willis MRN: [id number] Date of Birth: 1950/12/11 Referring Provider (PT): Kathyrn Lass, MD   Encounter Date: 05/27/2020   PT End of Session - 05/27/20 0936    Visit Number 10    Date for PT Re-Evaluation 06/16/20    PT Start Time 0930    PT Stop Time 1008    PT Time Calculation (min) 38 min    Activity Tolerance Patient tolerated treatment well    Behavior During Therapy [facility] for tasks assessed/performed           Past Medical History:  Diagnosis Date  . Complication of anesthesia    slow to awaken after Colonoscopy, "wipe out for 1 day and half after Tubal Ligation"  . Osteopenia 11/2015   T score -1.9 FRAX 8%/1%    Past Surgical History:  Procedure Laterality Date  . COLONOSCOPY    . COLONOSCOPY W/ POLYPECTOMY    . ORIF RADIAL FRACTURE Right 11/26/2016   Procedure: Right elbow open reduction and internal fixation and repair as indicated;  Surgeon: Iran Planas, MD;  Location: [facility];  Service: Orthopedics;  Laterality: Right;  . TUBAL LIGATION    . WISDOM TOOTH EXTRACTION      There were no vitals filed for this visit.   Subjective Assessment - 05/27/20 1014    Subjective Walked fast, had a great couple of days.  No leakage this week.  I feel good about discharge today.    Patient Stated Goals be able to walk without having leakage and not have to use depends/absorbant underwear    Currently in Pain? No/denies                             [facility] Adult PT Treatment/Exercise - 05/27/20 0001      Neuro Re-ed    Neuro Re-ed Details  reviewed HEP and hip hinge; exercises on foam mat      Lumbar Exercises:  Stretches   Other Lumbar Stretch Exercise thoracic ext in sitting; hip flexion stretch      Lumbar Exercises: Aerobic   Nustep  L 3 x 6 - PT present for status update      Lumbar Exercises: Standing   Lifting Limitations hip hinge technique - 10x - hard time breathing during movement in tall kneel with 6lb - 15    Shoulder Adduction Limitations half kneel and tandem press out and diagonals red band - 15x    Other Standing Lumbar Exercises pallof press and shoulder ext - green                    PT Short Term Goals - 03/31/20 1108      PT SHORT TERM GOAL #1   Title ind with intital HEP    Status Achieved      PT SHORT TERM GOAL #2   Title able to sustain pelvic floor MMT 2/5 for at least 10 sec x 3.    Status On-going             PT Long Term Goals - 05/27/20 0940      PT LONG TERM  GOAL #1   Title Pt will be able to walk 1 mile without leakage    Status Achieved      PT LONG TERM GOAL #2   Title Pt will be able to do activities in the house without leakage and not have to change clothes during the day    Baseline able to do and no changes    Status Achieved      PT LONG TERM GOAL #3   Title Pt will be ind with advanced HEP      PT LONG TERM GOAL #4   Title Pt will be able to increase walking speed for a fast walk for at least 5 minute intervals without leakage    Baseline able to walk fast on Sunday      PT LONG TERM GOAL #5   Title Pt will be confident to go out in the community in regular underwear    Baseline in the house and a few times, still not consistant    Status Partially Met                 Plan - 05/27/20 1011    Clinical Impression Statement Pt is doing great.  She has been able to walk fast and no leakage.  She has met most goals and is continuing to make progress.  PT and pt feels confident that she will continue to progress with her HEP at this time.    PT Treatment/Interventions ADLs/Self Care Home  Management;Biofeedback;Cryotherapy;Electrical Stimulation;Moist Heat;Traction;Neuromuscular re-education;Therapeutic exercise;Therapeutic activities;Gait training;Patient/family education;Manual techniques;Dry needling;Passive range of motion;Taping    PT Next Visit Plan d/c today    PT Home Exercise Plan Access Code: [email or web address]    Consulted and Agree with Plan of Care Patient           Patient will benefit from skilled therapeutic intervention in order to improve the following deficits and impairments:  Decreased strength, Decreased coordination, Decreased range of motion, Increased fascial restricitons, Increased muscle spasms  Visit Diagnosis: Cramp and spasm  Muscle weakness (generalized)  Unspecified lack of coordination     Problem List There are no problems to display for this patient.   Camillo Flaming Nadyne Gariepy, PT 05/27/2020, 10:35 AM  [facility] [address], [place], [place], [address] Phone: [phone]   Fax:  [phone]  Name: Mandy Willis MRN: [id number] Date of Birth: [birth year]  PHYSICAL THERAPY DISCHARGE SUMMARY  Visits from Start of Care: 10  Current functional level related to goals / functional outcomes: See  Above details   Remaining deficits: See above   Education / Equipment: HEP  Plan: Patient agrees to discharge.  Patient goals were met. Patient is being discharged due to meeting the stated rehab goals.  ?????    [employer], PT 05/27/20 12:18 PM

## 2020-06-02 DIAGNOSIS — Z0000 Encounter for general adult medical examination without abnormal findings: Secondary | ICD-10-CM | POA: Diagnosis not present

## 2020-06-02 DIAGNOSIS — M859 Disorder of bone density and structure, unspecified: Secondary | ICD-10-CM | POA: Diagnosis not present

## 2020-06-02 DIAGNOSIS — E7800 Pure hypercholesterolemia, unspecified: Secondary | ICD-10-CM | POA: Diagnosis not present

## 2020-06-02 DIAGNOSIS — Z1159 Encounter for screening for other viral diseases: Secondary | ICD-10-CM | POA: Diagnosis not present

## 2020-06-02 DIAGNOSIS — Z6829 Body mass index (BMI) 29.0-29.9, adult: Secondary | ICD-10-CM | POA: Diagnosis not present

## 2020-06-02 DIAGNOSIS — Z1211 Encounter for screening for malignant neoplasm of colon: Secondary | ICD-10-CM | POA: Diagnosis not present

## 2020-06-02 DIAGNOSIS — E663 Overweight: Secondary | ICD-10-CM | POA: Diagnosis not present

## 2020-06-03 ENCOUNTER — Encounter: Payer: Medicare HMO | Admitting: Physical Therapy

## 2020-06-10 ENCOUNTER — Encounter: Payer: Medicare HMO | Admitting: Physical Therapy

## 2020-07-15 DIAGNOSIS — R69 Illness, unspecified: Secondary | ICD-10-CM | POA: Diagnosis not present

## 2020-08-13 DIAGNOSIS — Z20828 Contact with and (suspected) exposure to other viral communicable diseases: Secondary | ICD-10-CM | POA: Diagnosis not present

## 2020-08-14 DIAGNOSIS — R509 Fever, unspecified: Secondary | ICD-10-CM | POA: Diagnosis not present

## 2020-08-14 DIAGNOSIS — R52 Pain, unspecified: Secondary | ICD-10-CM | POA: Diagnosis not present

## 2020-08-14 DIAGNOSIS — Z20822 Contact with and (suspected) exposure to covid-19: Secondary | ICD-10-CM | POA: Diagnosis not present

## 2020-08-14 DIAGNOSIS — T50Z95A Adverse effect of other vaccines and biological substances, initial encounter: Secondary | ICD-10-CM | POA: Diagnosis not present

## 2020-08-26 DIAGNOSIS — R69 Illness, unspecified: Secondary | ICD-10-CM | POA: Diagnosis not present

## 2020-09-23 DIAGNOSIS — J40 Bronchitis, not specified as acute or chronic: Secondary | ICD-10-CM | POA: Diagnosis not present

## 2020-10-07 DIAGNOSIS — Z0100 Encounter for examination of eyes and vision without abnormal findings: Secondary | ICD-10-CM | POA: Diagnosis not present

## 2020-11-06 DIAGNOSIS — K120 Recurrent oral aphthae: Secondary | ICD-10-CM | POA: Diagnosis not present

## 2020-12-30 DIAGNOSIS — M940 Chondrocostal junction syndrome [Tietze]: Secondary | ICD-10-CM | POA: Diagnosis not present

## 2020-12-30 DIAGNOSIS — M349 Systemic sclerosis, unspecified: Secondary | ICD-10-CM | POA: Diagnosis not present

## 2021-01-21 DIAGNOSIS — D225 Melanocytic nevi of trunk: Secondary | ICD-10-CM | POA: Diagnosis not present

## 2021-01-21 DIAGNOSIS — L280 Lichen simplex chronicus: Secondary | ICD-10-CM | POA: Diagnosis not present

## 2021-04-21 DIAGNOSIS — E669 Obesity, unspecified: Secondary | ICD-10-CM | POA: Diagnosis not present

## 2021-04-21 DIAGNOSIS — Z1389 Encounter for screening for other disorder: Secondary | ICD-10-CM | POA: Diagnosis not present

## 2021-04-21 DIAGNOSIS — M8580 Other specified disorders of bone density and structure, unspecified site: Secondary | ICD-10-CM | POA: Diagnosis not present

## 2021-04-21 DIAGNOSIS — Z0000 Encounter for general adult medical examination without abnormal findings: Secondary | ICD-10-CM | POA: Diagnosis not present

## 2021-04-21 DIAGNOSIS — E559 Vitamin D deficiency, unspecified: Secondary | ICD-10-CM | POA: Diagnosis not present

## 2021-04-21 DIAGNOSIS — Z131 Encounter for screening for diabetes mellitus: Secondary | ICD-10-CM | POA: Diagnosis not present

## 2021-07-20 DIAGNOSIS — M5416 Radiculopathy, lumbar region: Secondary | ICD-10-CM | POA: Diagnosis not present

## 2021-07-20 DIAGNOSIS — M9903 Segmental and somatic dysfunction of lumbar region: Secondary | ICD-10-CM | POA: Diagnosis not present

## 2021-08-27 DIAGNOSIS — N390 Urinary tract infection, site not specified: Secondary | ICD-10-CM | POA: Diagnosis not present

## 2021-08-27 DIAGNOSIS — H109 Unspecified conjunctivitis: Secondary | ICD-10-CM | POA: Diagnosis not present

## 2021-08-27 DIAGNOSIS — R059 Cough, unspecified: Secondary | ICD-10-CM | POA: Diagnosis not present

## 2021-08-27 DIAGNOSIS — Z20822 Contact with and (suspected) exposure to covid-19: Secondary | ICD-10-CM | POA: Diagnosis not present

## 2021-09-07 IMAGING — US US BREAST*L* LIMITED INC AXILLA
1 series · 4 of 4 positions shown · non-contrast
Comparison: Previous exam(s).

CLINICAL DATA: Screening recall for a possible left breast mass.

EXAM:
DIGITAL DIAGNOSTIC UNILATERAL LEFT MAMMOGRAM WITH TOMO AND CAD;
ULTRASOUND LEFT BREAST LIMITED

[Series 1: us breast*left* limited inc axilla · 0.06mm/px · 4 of 4 slices shown]
[im 1/4]
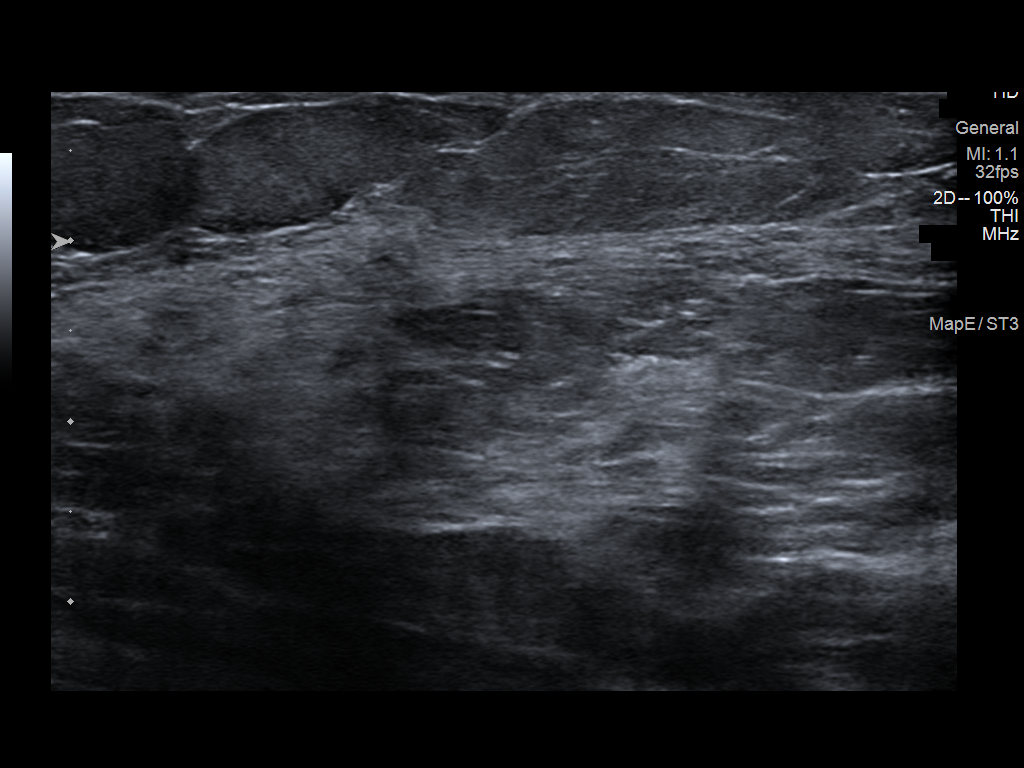
[im 2/4]
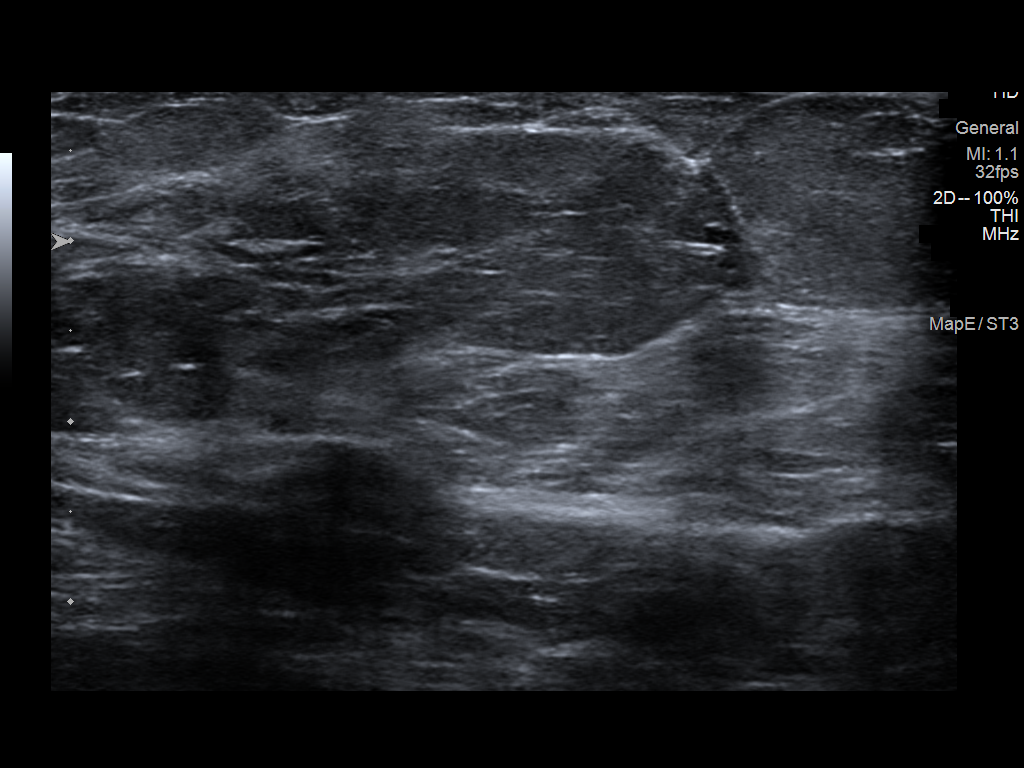
[im 3/4]
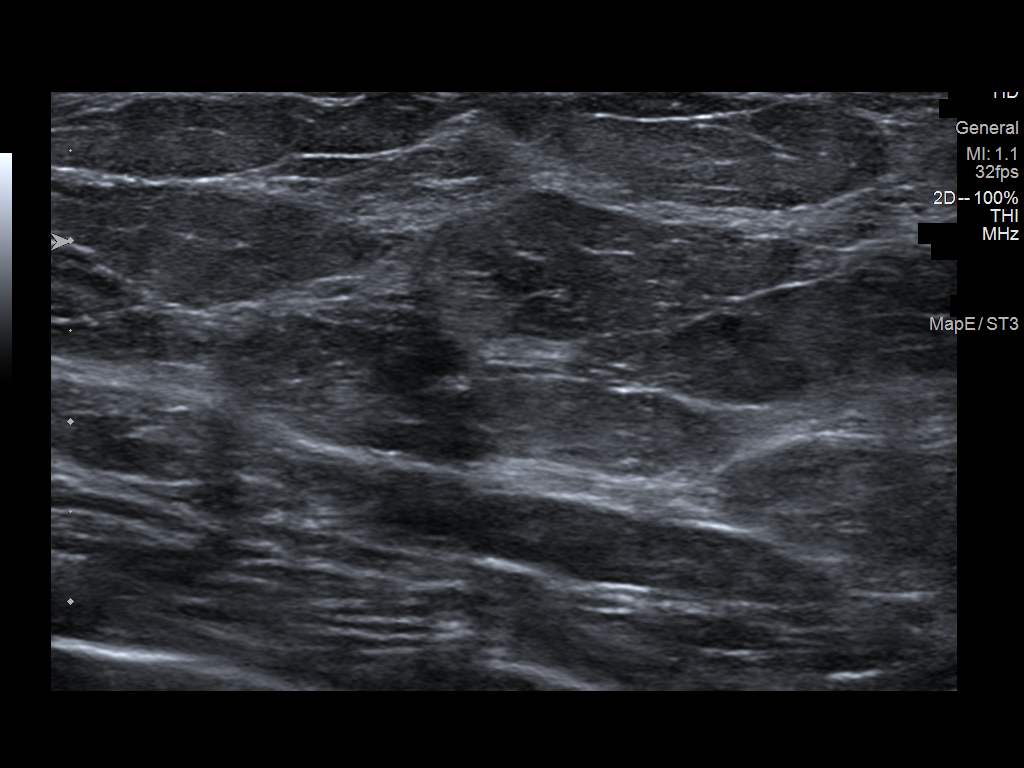
[im 4/4]
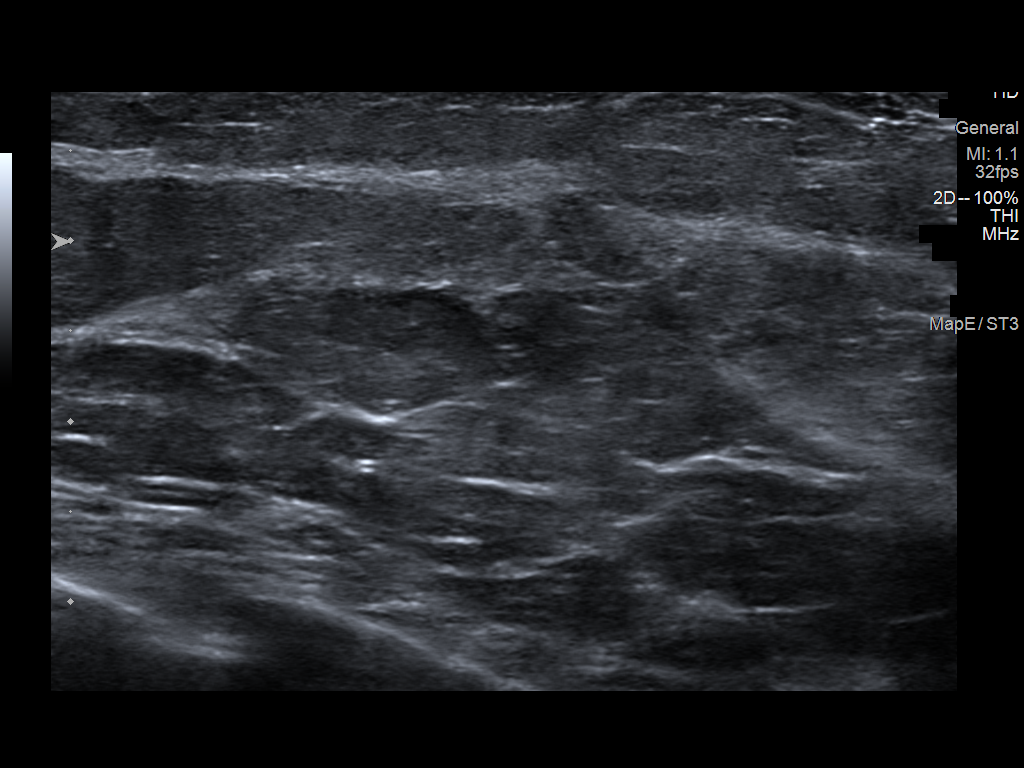

[4 of 4 positions shown; findings below may reference images not displayed]

ACR Breast Density Category c: The breast tissue is heterogeneously
dense, which may obscure small masses.
FINDINGS: Spot compression tomosynthesis images through the superior left
breast demonstrates no definite persistent oval circumscribed mass
as that seen on the screening mammogram.

Mammographic images were processed with CAD.

Ultrasound targeted to the superior to upper inner left breast
demonstrates normal fibroglandular tissue. No suspicious masses or
areas of shadowing are identified.
IMPRESSION: Resolution of the small mass seen in the superior left breast on the
screening mammogram. This likely represented a small cyst which has
resolved.

RECOMMENDATION:
Screening mammogram in one year.(Code:K8-N-BPG)

I have discussed the findings and recommendations with the patient.
If applicable, a reminder letter will be sent to the patient
regarding the next appointment.

BI-RADS CATEGORY  1: Negative.

## 2021-09-07 IMAGING — MG MM DIGITAL DIAGNOSTIC UNILAT*L* W/ TOMO W/ CAD
4 series · 4 of 12 positions shown · non-contrast
Comparison: Previous exam(s).

CLINICAL DATA: Screening recall for a possible left breast mass.

EXAM:
DIGITAL DIAGNOSTIC UNILATERAL LEFT MAMMOGRAM WITH TOMO AND CAD;
ULTRASOUND LEFT BREAST LIMITED

[L MLO synth-2D]
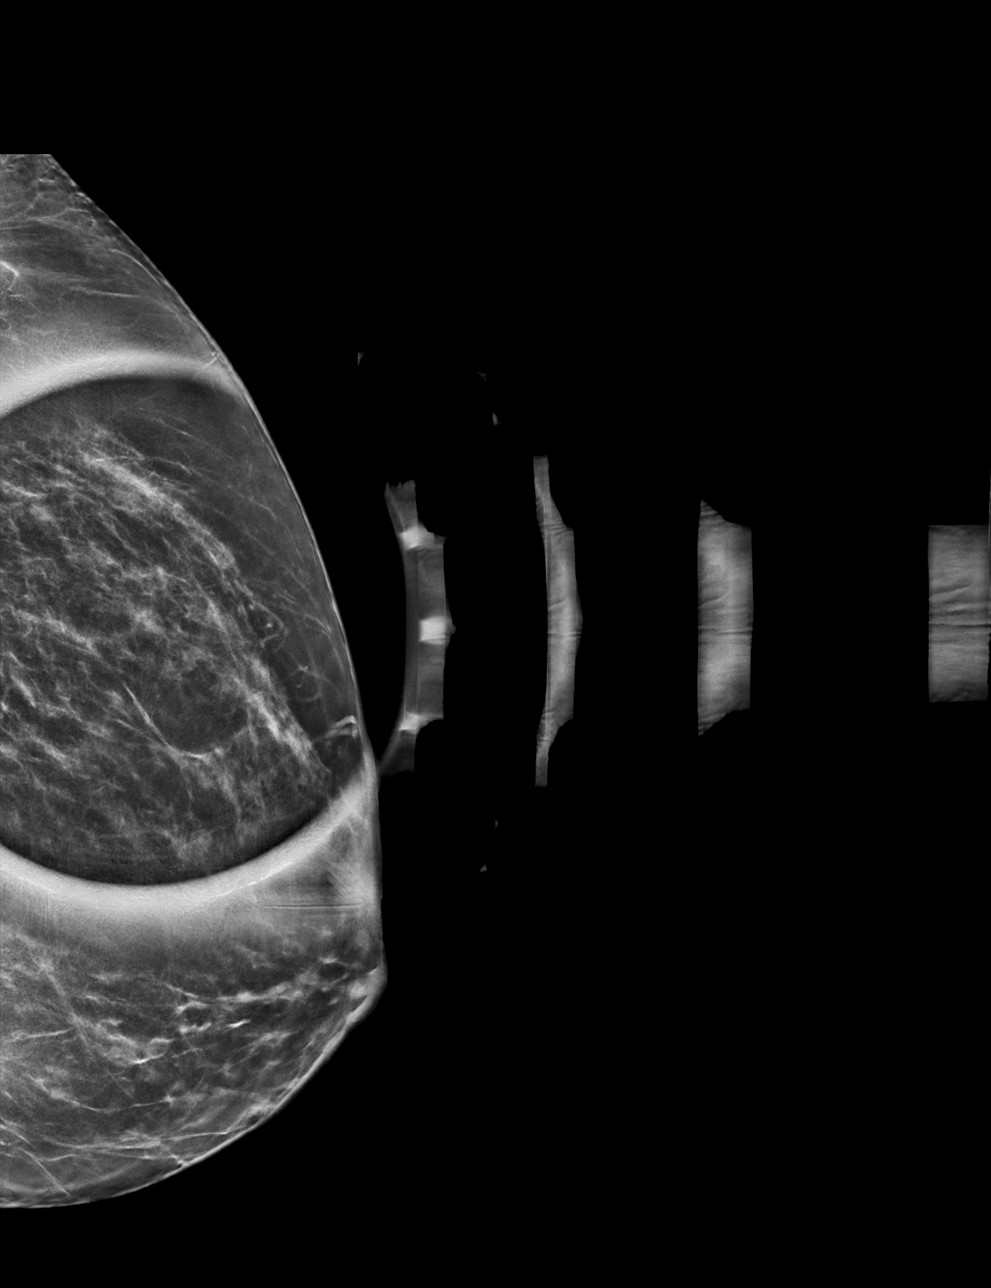

[L ML synth-2D]
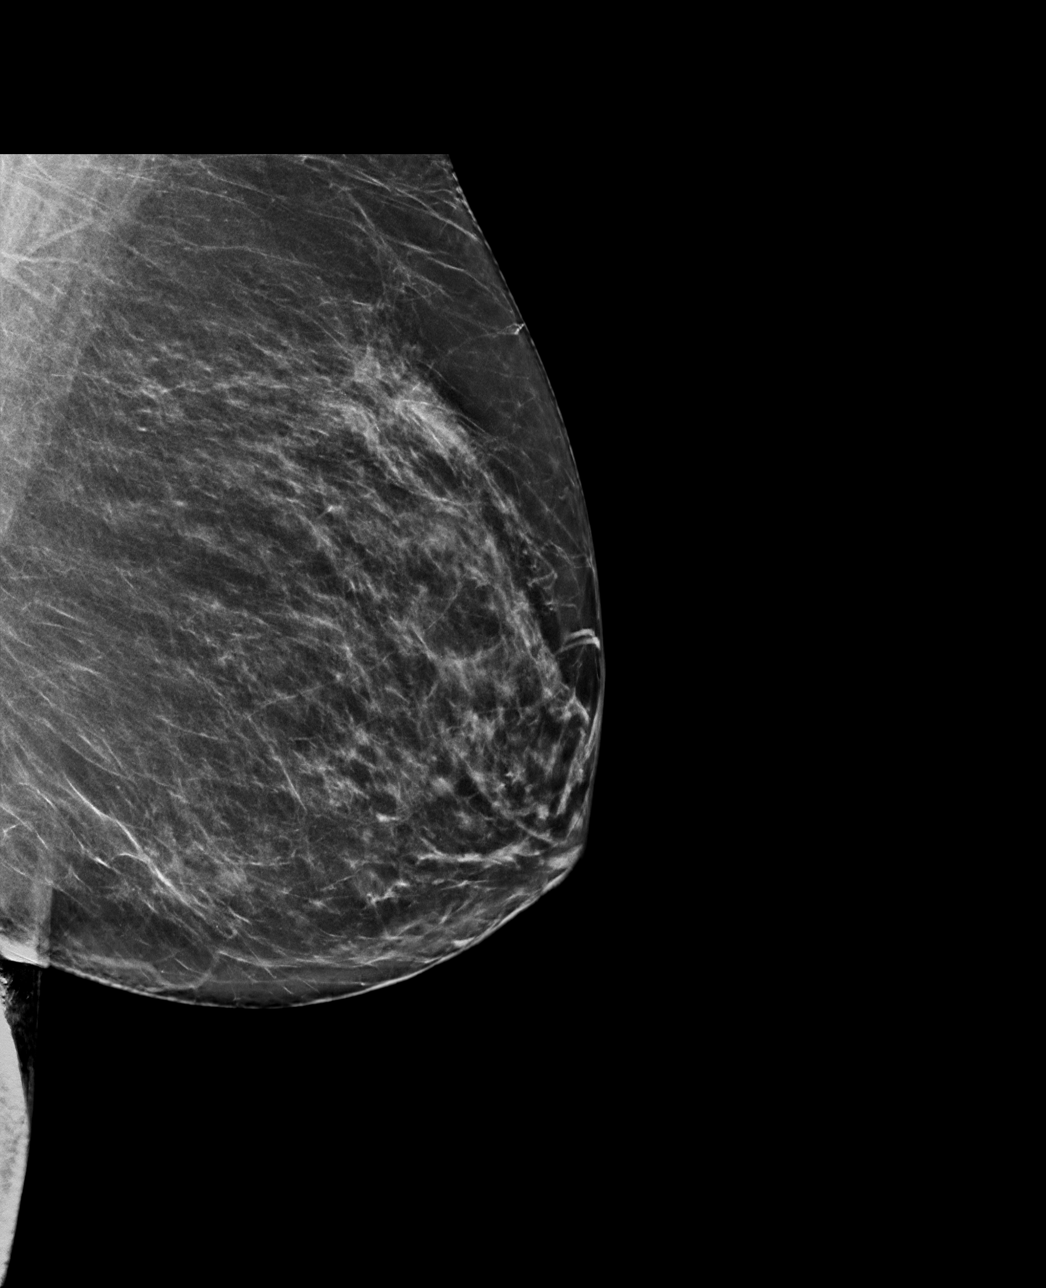

[L ML tomo · tomo slice 39/76.0]
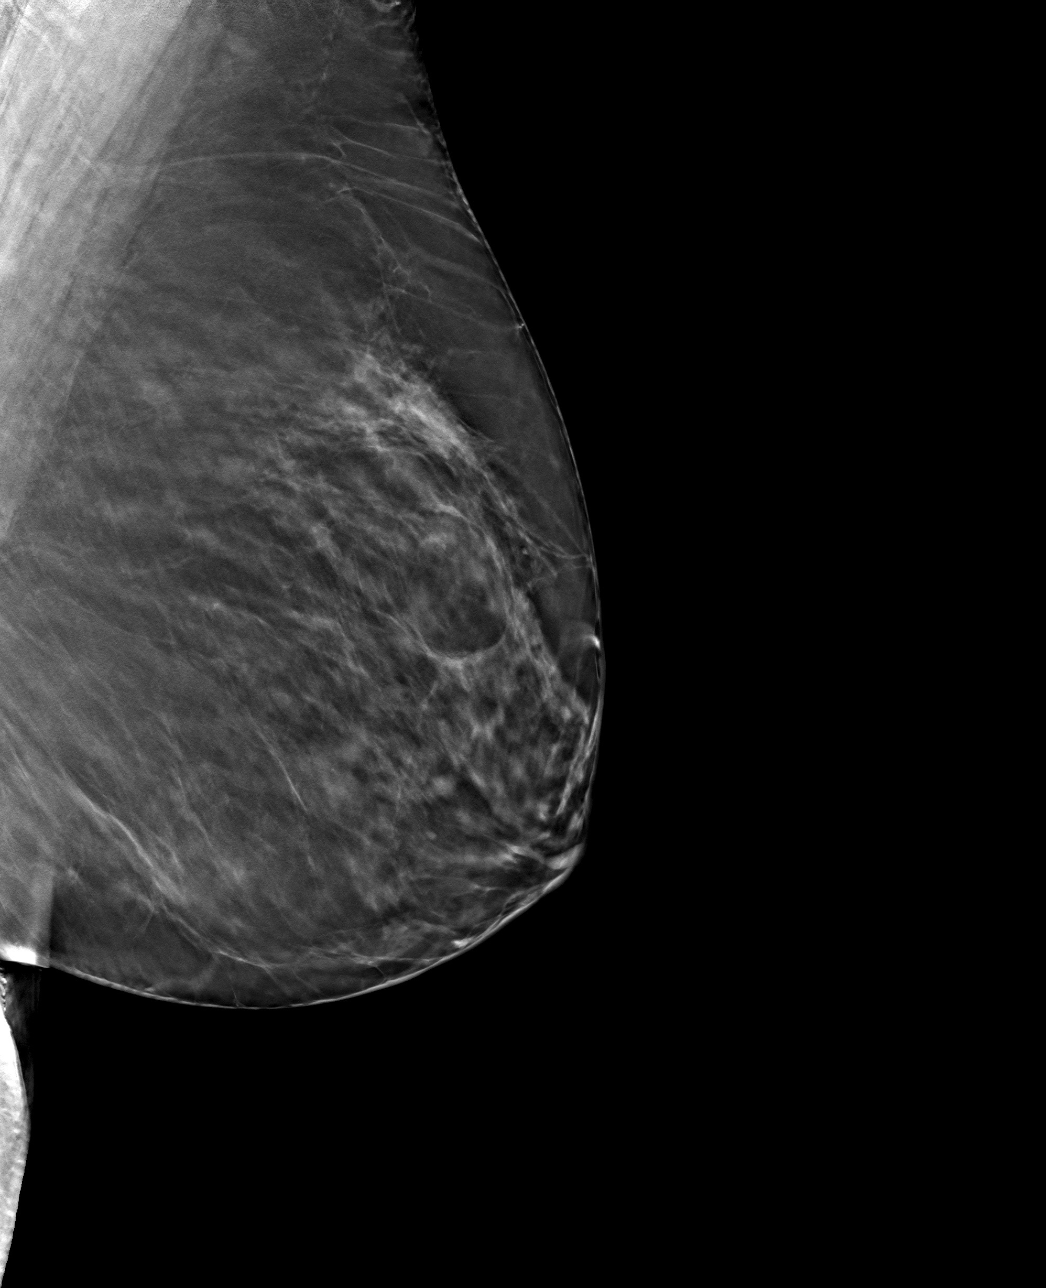

[L MLO tomo · tomo slice 32/63.0]
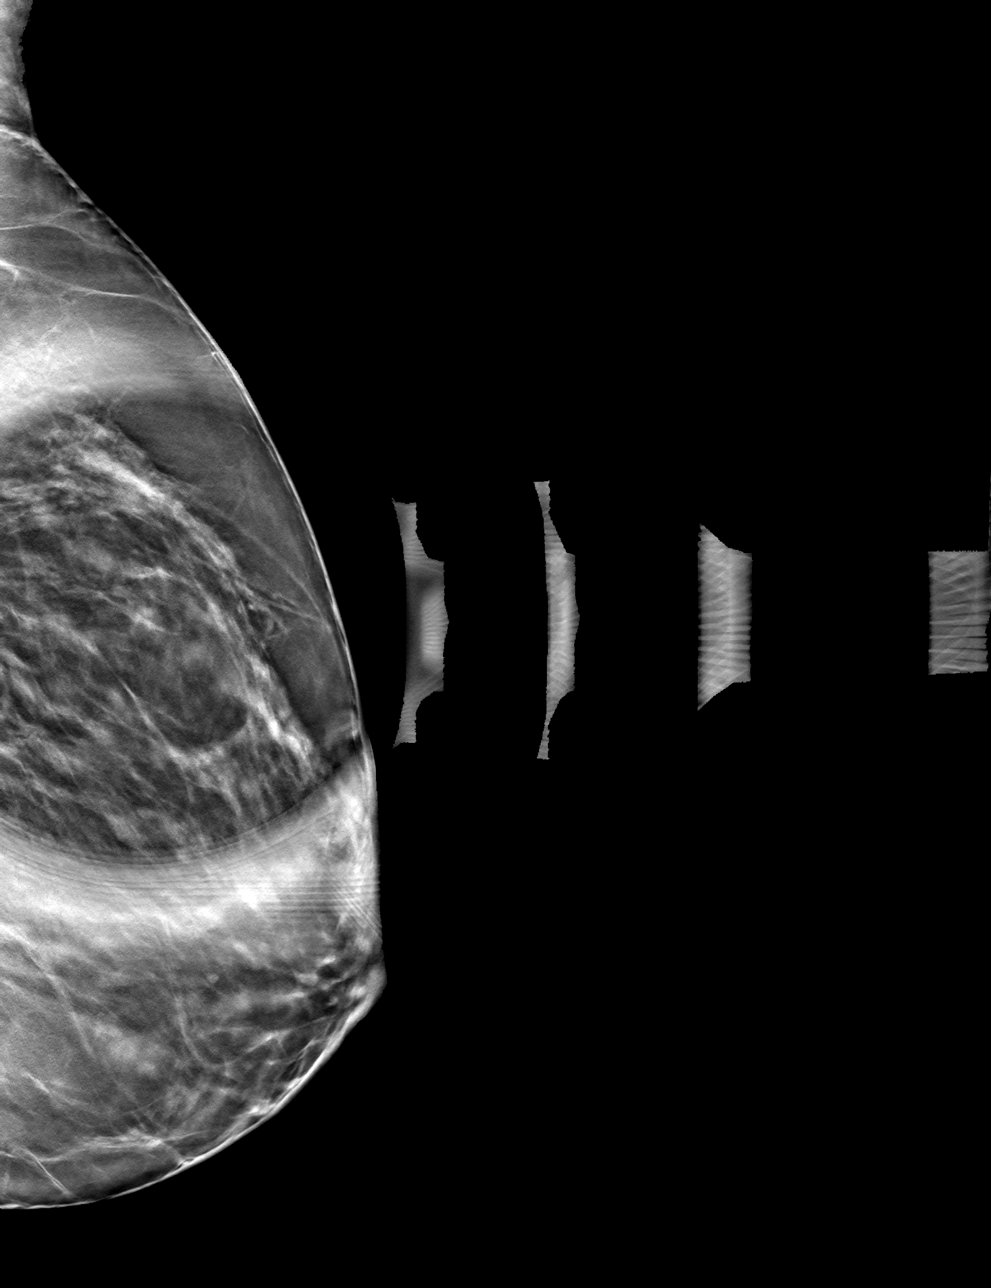

[4 of 12 positions shown; findings below may reference images not displayed]

ACR Breast Density Category c: The breast tissue is heterogeneously
dense, which may obscure small masses.
FINDINGS: Spot compression tomosynthesis images through the superior left
breast demonstrates no definite persistent oval circumscribed mass
as that seen on the screening mammogram.

Mammographic images were processed with CAD.

Ultrasound targeted to the superior to upper inner left breast
demonstrates normal fibroglandular tissue. No suspicious masses or
areas of shadowing are identified.
IMPRESSION: Resolution of the small mass seen in the superior left breast on the
screening mammogram. This likely represented a small cyst which has
resolved.

RECOMMENDATION:
Screening mammogram in one year.(Code:K8-N-BPG)

I have discussed the findings and recommendations with the patient.
If applicable, a reminder letter will be sent to the patient
regarding the next appointment.

BI-RADS CATEGORY  1: Negative.

## 2021-09-10 ENCOUNTER — Other Ambulatory Visit: Payer: Self-pay | Admitting: Family Medicine

## 2021-09-10 DIAGNOSIS — Z1231 Encounter for screening mammogram for malignant neoplasm of breast: Secondary | ICD-10-CM

## 2021-09-23 ENCOUNTER — Ambulatory Visit
Admission: RE | Admit: 2021-09-23 | Discharge: 2021-09-23 | Disposition: A | Discharge status: Home / Self Care | Payer: Medicare HMO | Source: Ambulatory Visit

## 2021-09-23 DIAGNOSIS — Z1231 Encounter for screening mammogram for malignant neoplasm of breast: Secondary | ICD-10-CM | POA: Diagnosis not present

## 2021-11-12 DIAGNOSIS — Z0100 Encounter for examination of eyes and vision without abnormal findings: Secondary | ICD-10-CM | POA: Diagnosis not present

## 2022-02-03 DIAGNOSIS — R32 Unspecified urinary incontinence: Secondary | ICD-10-CM | POA: Diagnosis not present

## 2022-02-03 DIAGNOSIS — G8929 Other chronic pain: Secondary | ICD-10-CM | POA: Diagnosis not present

## 2022-02-03 DIAGNOSIS — Z791 Long term (current) use of non-steroidal anti-inflammatories (NSAID): Secondary | ICD-10-CM | POA: Diagnosis not present

## 2022-02-03 DIAGNOSIS — Z8249 Family history of ischemic heart disease and other diseases of the circulatory system: Secondary | ICD-10-CM | POA: Diagnosis not present

## 2022-02-03 DIAGNOSIS — Z6831 Body mass index (BMI) 31.0-31.9, adult: Secondary | ICD-10-CM | POA: Diagnosis not present

## 2022-02-03 DIAGNOSIS — E669 Obesity, unspecified: Secondary | ICD-10-CM | POA: Diagnosis not present

## 2022-02-03 DIAGNOSIS — Z825 Family history of asthma and other chronic lower respiratory diseases: Secondary | ICD-10-CM | POA: Diagnosis not present

## 2022-02-03 DIAGNOSIS — R030 Elevated blood-pressure reading, without diagnosis of hypertension: Secondary | ICD-10-CM | POA: Diagnosis not present

## 2022-05-13 DIAGNOSIS — N3001 Acute cystitis with hematuria: Secondary | ICD-10-CM | POA: Diagnosis not present

## 2022-05-13 DIAGNOSIS — E669 Obesity, unspecified: Secondary | ICD-10-CM | POA: Diagnosis not present

## 2022-05-13 DIAGNOSIS — Z6830 Body mass index (BMI) 30.0-30.9, adult: Secondary | ICD-10-CM | POA: Diagnosis not present

## 2022-05-13 DIAGNOSIS — Z0000 Encounter for general adult medical examination without abnormal findings: Secondary | ICD-10-CM | POA: Diagnosis not present

## 2022-05-13 DIAGNOSIS — Z1389 Encounter for screening for other disorder: Secondary | ICD-10-CM | POA: Diagnosis not present

## 2022-05-13 DIAGNOSIS — Z23 Encounter for immunization: Secondary | ICD-10-CM | POA: Diagnosis not present

## 2022-05-17 DIAGNOSIS — R519 Headache, unspecified: Secondary | ICD-10-CM | POA: Diagnosis not present

## 2022-05-17 DIAGNOSIS — E7800 Pure hypercholesterolemia, unspecified: Secondary | ICD-10-CM | POA: Diagnosis not present

## 2022-05-17 DIAGNOSIS — Z131 Encounter for screening for diabetes mellitus: Secondary | ICD-10-CM | POA: Diagnosis not present

## 2022-05-17 DIAGNOSIS — E669 Obesity, unspecified: Secondary | ICD-10-CM | POA: Diagnosis not present

## 2022-05-17 DIAGNOSIS — Z6830 Body mass index (BMI) 30.0-30.9, adult: Secondary | ICD-10-CM | POA: Diagnosis not present

## 2022-05-17 DIAGNOSIS — M8580 Other specified disorders of bone density and structure, unspecified site: Secondary | ICD-10-CM | POA: Diagnosis not present

## 2022-05-17 DIAGNOSIS — E559 Vitamin D deficiency, unspecified: Secondary | ICD-10-CM | POA: Diagnosis not present

## 2022-05-17 DIAGNOSIS — Z0000 Encounter for general adult medical examination without abnormal findings: Secondary | ICD-10-CM | POA: Diagnosis not present

## 2022-05-19 ENCOUNTER — Other Ambulatory Visit: Payer: Self-pay | Admitting: Family Medicine

## 2022-05-19 DIAGNOSIS — M8580 Other specified disorders of bone density and structure, unspecified site: Secondary | ICD-10-CM

## 2022-05-19 DIAGNOSIS — Z1231 Encounter for screening mammogram for malignant neoplasm of breast: Secondary | ICD-10-CM

## 2022-05-31 ENCOUNTER — Other Ambulatory Visit: Payer: Self-pay | Admitting: Family Medicine

## 2022-05-31 ENCOUNTER — Other Ambulatory Visit (HOSPITAL_COMMUNITY): Payer: Self-pay | Admitting: Family Medicine

## 2022-05-31 DIAGNOSIS — E7800 Pure hypercholesterolemia, unspecified: Secondary | ICD-10-CM

## 2022-06-10 ENCOUNTER — Ambulatory Visit (HOSPITAL_COMMUNITY)
Admission: RE | Admit: 2022-06-10 | Discharge: 2022-06-10 | Disposition: A | Discharge status: Home / Self Care | Payer: Medicare HMO | Source: Ambulatory Visit | Attending: Family Medicine | Admitting: Family Medicine

## 2022-06-10 DIAGNOSIS — E7800 Pure hypercholesterolemia, unspecified: Secondary | ICD-10-CM | POA: Insufficient documentation

## 2022-08-06 DIAGNOSIS — R319 Hematuria, unspecified: Secondary | ICD-10-CM | POA: Diagnosis not present

## 2022-08-06 DIAGNOSIS — N390 Urinary tract infection, site not specified: Secondary | ICD-10-CM | POA: Diagnosis not present

## 2022-08-06 DIAGNOSIS — N3001 Acute cystitis with hematuria: Secondary | ICD-10-CM | POA: Diagnosis not present

## 2022-08-31 DIAGNOSIS — Z0100 Encounter for examination of eyes and vision without abnormal findings: Secondary | ICD-10-CM | POA: Diagnosis not present

## 2022-09-02 DIAGNOSIS — R69 Illness, unspecified: Secondary | ICD-10-CM | POA: Diagnosis not present

## 2022-09-10 DIAGNOSIS — I700 Atherosclerosis of aorta: Secondary | ICD-10-CM | POA: Diagnosis not present

## 2022-09-24 ENCOUNTER — Ambulatory Visit
Admission: RE | Admit: 2022-09-24 | Discharge: 2022-09-24 | Disposition: A | Discharge status: Home / Self Care | Payer: Medicare HMO | Source: Ambulatory Visit | Attending: Family Medicine | Admitting: Family Medicine

## 2022-09-24 DIAGNOSIS — Z1231 Encounter for screening mammogram for malignant neoplasm of breast: Secondary | ICD-10-CM | POA: Diagnosis not present

## 2022-11-10 DIAGNOSIS — J029 Acute pharyngitis, unspecified: Secondary | ICD-10-CM | POA: Diagnosis not present

## 2022-11-15 ENCOUNTER — Ambulatory Visit
Admission: RE | Admit: 2022-11-15 | Discharge: 2022-11-15 | Disposition: A | Discharge status: Home / Self Care | Payer: Medicare HMO | Source: Ambulatory Visit | Attending: Family Medicine | Admitting: Family Medicine

## 2022-11-15 DIAGNOSIS — M8589 Other specified disorders of bone density and structure, multiple sites: Secondary | ICD-10-CM | POA: Diagnosis not present

## 2022-11-15 DIAGNOSIS — Z780 Asymptomatic menopausal state: Secondary | ICD-10-CM | POA: Diagnosis not present

## 2022-11-15 DIAGNOSIS — M8580 Other specified disorders of bone density and structure, unspecified site: Secondary | ICD-10-CM

## 2022-12-21 DIAGNOSIS — G8929 Other chronic pain: Secondary | ICD-10-CM | POA: Diagnosis not present

## 2022-12-21 DIAGNOSIS — Z8249 Family history of ischemic heart disease and other diseases of the circulatory system: Secondary | ICD-10-CM | POA: Diagnosis not present

## 2022-12-21 DIAGNOSIS — R32 Unspecified urinary incontinence: Secondary | ICD-10-CM | POA: Diagnosis not present

## 2022-12-21 DIAGNOSIS — M8580 Other specified disorders of bone density and structure, unspecified site: Secondary | ICD-10-CM | POA: Diagnosis not present

## 2022-12-21 DIAGNOSIS — E785 Hyperlipidemia, unspecified: Secondary | ICD-10-CM | POA: Diagnosis not present

## 2023-05-17 DIAGNOSIS — Z0000 Encounter for general adult medical examination without abnormal findings: Secondary | ICD-10-CM | POA: Diagnosis not present

## 2023-05-17 DIAGNOSIS — Z6829 Body mass index (BMI) 29.0-29.9, adult: Secondary | ICD-10-CM | POA: Diagnosis not present

## 2023-05-17 DIAGNOSIS — Z1331 Encounter for screening for depression: Secondary | ICD-10-CM | POA: Diagnosis not present

## 2023-05-23 DIAGNOSIS — I700 Atherosclerosis of aorta: Secondary | ICD-10-CM | POA: Diagnosis not present

## 2023-05-23 DIAGNOSIS — N952 Postmenopausal atrophic vaginitis: Secondary | ICD-10-CM | POA: Diagnosis not present

## 2023-05-23 DIAGNOSIS — I2510 Atherosclerotic heart disease of native coronary artery without angina pectoris: Secondary | ICD-10-CM | POA: Diagnosis not present

## 2023-05-23 DIAGNOSIS — Z6829 Body mass index (BMI) 29.0-29.9, adult: Secondary | ICD-10-CM | POA: Diagnosis not present

## 2023-05-23 DIAGNOSIS — N390 Urinary tract infection, site not specified: Secondary | ICD-10-CM | POA: Diagnosis not present

## 2023-05-23 DIAGNOSIS — M8580 Other specified disorders of bone density and structure, unspecified site: Secondary | ICD-10-CM | POA: Diagnosis not present

## 2023-05-23 DIAGNOSIS — E663 Overweight: Secondary | ICD-10-CM | POA: Diagnosis not present

## 2023-07-11 DIAGNOSIS — M7918 Myalgia, other site: Secondary | ICD-10-CM | POA: Diagnosis not present

## 2023-09-21 DIAGNOSIS — Z0100 Encounter for examination of eyes and vision without abnormal findings: Secondary | ICD-10-CM | POA: Diagnosis not present

## 2023-09-26 DIAGNOSIS — H25813 Combined forms of age-related cataract, bilateral: Secondary | ICD-10-CM | POA: Diagnosis not present

## 2023-09-26 DIAGNOSIS — D3132 Benign neoplasm of left choroid: Secondary | ICD-10-CM | POA: Diagnosis not present

## 2023-10-12 ENCOUNTER — Other Ambulatory Visit: Payer: Self-pay | Admitting: Medical Genetics

## 2023-10-13 DIAGNOSIS — H25811 Combined forms of age-related cataract, right eye: Secondary | ICD-10-CM | POA: Diagnosis not present

## 2023-10-13 DIAGNOSIS — F419 Anxiety disorder, unspecified: Secondary | ICD-10-CM | POA: Diagnosis not present

## 2023-10-20 ENCOUNTER — Other Ambulatory Visit (HOSPITAL_COMMUNITY): Payer: Self-pay

## 2023-10-24 ENCOUNTER — Other Ambulatory Visit: Payer: Medicare HMO

## 2023-10-24 DIAGNOSIS — Z006 Encounter for examination for normal comparison and control in clinical research program: Secondary | ICD-10-CM

## 2023-10-27 DIAGNOSIS — H25812 Combined forms of age-related cataract, left eye: Secondary | ICD-10-CM | POA: Diagnosis not present

## 2023-11-03 LAB — GENECONNECT MOLECULAR SCREEN: Genetic Analysis Overall Interpretation: NEGATIVE

## 2023-12-19 DIAGNOSIS — Z0100 Encounter for examination of eyes and vision without abnormal findings: Secondary | ICD-10-CM | POA: Diagnosis not present

## 2023-12-31 DIAGNOSIS — H1031 Unspecified acute conjunctivitis, right eye: Secondary | ICD-10-CM | POA: Diagnosis not present

## 2024-05-23 DIAGNOSIS — Z1331 Encounter for screening for depression: Secondary | ICD-10-CM | POA: Diagnosis not present

## 2024-05-23 DIAGNOSIS — Z131 Encounter for screening for diabetes mellitus: Secondary | ICD-10-CM | POA: Diagnosis not present

## 2024-05-23 DIAGNOSIS — E669 Obesity, unspecified: Secondary | ICD-10-CM | POA: Diagnosis not present

## 2024-05-23 DIAGNOSIS — Z0000 Encounter for general adult medical examination without abnormal findings: Secondary | ICD-10-CM | POA: Diagnosis not present

## 2024-05-23 DIAGNOSIS — M8580 Other specified disorders of bone density and structure, unspecified site: Secondary | ICD-10-CM | POA: Diagnosis not present

## 2024-05-28 DIAGNOSIS — Z1212 Encounter for screening for malignant neoplasm of rectum: Secondary | ICD-10-CM | POA: Diagnosis not present

## 2024-06-01 LAB — COLOGUARD: COLOGUARD: NEGATIVE
